# Patient Record
Sex: Male | Born: 1980 | Race: Black or African American | Hispanic: No | Marital: Single | Smoking: Former smoker
Health system: Southern US, Community
[De-identification: ages and names within clinical notes are randomized; demographics above are authoritative.]

## PROBLEM LIST (undated history)

## (undated) DIAGNOSIS — Z720 Tobacco use: Secondary | ICD-10-CM

## (undated) DIAGNOSIS — D869 Sarcoidosis, unspecified: Secondary | ICD-10-CM

## (undated) DIAGNOSIS — R590 Localized enlarged lymph nodes: Secondary | ICD-10-CM

## (undated) DIAGNOSIS — K219 Gastro-esophageal reflux disease without esophagitis: Secondary | ICD-10-CM

## (undated) HISTORY — DX: Tobacco use: Z72.0

## (undated) HISTORY — DX: Localized enlarged lymph nodes: R59.0

## (undated) HISTORY — PX: BRONCHOSCOPY: SUR163

## (undated) HISTORY — DX: Sarcoidosis, unspecified: D86.9

## (undated) HISTORY — DX: Gastro-esophageal reflux disease without esophagitis: K21.9

---

## 2005-02-16 ENCOUNTER — Emergency Department (HOSPITAL_COMMUNITY): Admission: EM | Admit: 2005-02-16 | Discharge: 2005-02-17 | Payer: Self-pay | Admitting: Emergency Medicine

## 2006-05-28 ENCOUNTER — Emergency Department (HOSPITAL_COMMUNITY): Admission: EM | Admit: 2006-05-28 | Discharge: 2006-05-28 | Payer: Self-pay | Admitting: Emergency Medicine

## 2006-07-22 ENCOUNTER — Emergency Department (HOSPITAL_COMMUNITY): Admission: EM | Admit: 2006-07-22 | Discharge: 2006-07-22 | Payer: Self-pay | Admitting: Emergency Medicine

## 2006-07-22 ENCOUNTER — Emergency Department (HOSPITAL_COMMUNITY): Admission: EM | Admit: 2006-07-22 | Discharge: 2006-07-22 | Payer: Self-pay | Admitting: Family Medicine

## 2006-11-05 ENCOUNTER — Emergency Department (HOSPITAL_COMMUNITY): Admission: EM | Admit: 2006-11-05 | Discharge: 2006-11-05 | Payer: Self-pay | Admitting: Emergency Medicine

## 2007-08-20 ENCOUNTER — Emergency Department (HOSPITAL_COMMUNITY): Admission: EM | Admit: 2007-08-20 | Discharge: 2007-08-20 | Payer: Self-pay | Admitting: Emergency Medicine

## 2009-07-28 ENCOUNTER — Emergency Department (HOSPITAL_COMMUNITY): Admission: EM | Admit: 2009-07-28 | Discharge: 2009-07-28 | Payer: Self-pay | Admitting: Emergency Medicine

## 2009-07-29 ENCOUNTER — Emergency Department (HOSPITAL_COMMUNITY): Admission: EM | Admit: 2009-07-29 | Discharge: 2009-07-30 | Payer: Self-pay | Admitting: Emergency Medicine

## 2011-04-28 NOTE — Consult Note (Signed)
[name], [name]             ACCOUNT NO.:  [id number]   MEDICAL RECORD NO.:  [id number]          PATIENT TYPE:  EMS   LOCATION:  ED                           FACILITY:  [facility]   PHYSICIAN:  Newman Pies, MD            DATE OF BIRTH:  August 18, 1981   DATE OF CONSULTATION:  07/30/2009  DATE OF DISCHARGE:  07/30/2009                                 CONSULTATION   CHIEF COMPLAINT:  Infected thyroglossal duct cyst.   HISTORY OF PRESENT ILLNESS:  The patient is a 30 year old African  American male who presents to the [facility] Emergency Room today  complaining of 3 weeks history of neck pain.  The severity of his neck  pain has gradually worsened over the past 3 weeks.  He complains of  significant dysphagia and odynophagia.  He was seen at the same  emergency room 1 day earlier.  At that time, he was treated with  azithromycin and was discharged home.  The patient returns today  complaining of worsening of his symptoms.  He also complains of  subjective fever for the last 2 days.  He has a history of recurrent  strep throat infections.  He denies any previous history of ENT surgery.  A neck CT scan was obtained today at the emergency room.  It shows a 1.6  cm midline neck cyst immediately superior to the hyoid bone.  The  location is suggestive of an infected thyroglossal duct cyst.  No other  lymphadenopathy or aerodigestive tract tumor or mass.   PAST MEDICAL HISTORY:  Recurrent strep throat infections.   PAST SURGICAL HISTORY:  None.   HOME MEDICATIONS:  Azithromycin.   ALLERGIES:  No known drug allergy.   SOCIAL HISTORY:  The patient is a current smoker.  He typically smokes  several cigars a day.  He has been doing that for the past 7-8 years.  He denies the use of illegal drugs.  Occasional drinker.   FAMILY HISTORY:  Noncontributory.   PHYSICAL EXAMINATION:  VITAL SIGNS:  Temperature 101.2, blood pressure  148/88, pulse 60, respirations 18, and oxygen saturation 96% on  room  air.  GENERAL:  The patient is a well-nourished and well-developed 30 year old  African American male in no acute distress.  He is alert and oriented  x3.  HEENT:  His pupils are equal, round, and reactive to light.  Extraocular  motion is intact.  Examination of the ears shows normal auricles,  external auditory canals, and tympanic membranes.  Nasal examination  shows normal mucosa, septum, and turbinates.  Oral cavity examination  shows normal lips, gums, tongue, oral cavity, and oropharyngeal mucosa.  2+ tonsils are noted bilaterally.  Several tonsillar lymphs are noted,  however, no obvious erythema or edema is noted.  NECK:  Palpation of the neck reveals a tender area adjacent to the hyoid  bone.  No obvious lymphadenopathy or masses noted.  The thyroid is  midline.  No palpable mass is noted.  The cervical range of motion is  limited by his neck pain.   IMPRESSION:  The  patient's history, physical exam findings, and the CT  findings are suggestive of an infected thyroglossal duct cyst.   RECOMMENDATIONS:  1. The patient will receive clindamycin 900 mg IV x1 in the emergency      room.  He will be discharged home on clindamycin 300 mg p.o. q.i.d.      for 10 days.  2. The patient will follow up in my office in approximately 1 week.  3. The patient will likely need to have the thyroglossal duct cyst      removed in accordance with a Sistrunk procedure.  The patient is      instructed to call the office if his symptoms worsen.      Newman Pies, MD  Electronically Signed     ST/MEDQ  D:  07/30/2009  T:  07/30/2009  Job:  [id number]

## 2011-05-18 ENCOUNTER — Inpatient Hospital Stay (HOSPITAL_COMMUNITY)
Admission: EM | Admit: 2011-05-18 | Discharge: 2011-05-21 | DRG: 076 | Disposition: A | Discharge status: Home / Self Care | Payer: BC Managed Care – PPO | Attending: Internal Medicine | Admitting: Internal Medicine

## 2011-05-18 ENCOUNTER — Emergency Department (HOSPITAL_COMMUNITY): Payer: BC Managed Care – PPO

## 2011-05-18 DIAGNOSIS — R079 Chest pain, unspecified: Secondary | ICD-10-CM | POA: Diagnosis present

## 2011-05-18 DIAGNOSIS — R599 Enlarged lymph nodes, unspecified: Secondary | ICD-10-CM | POA: Diagnosis present

## 2011-05-18 DIAGNOSIS — F17200 Nicotine dependence, unspecified, uncomplicated: Secondary | ICD-10-CM | POA: Diagnosis present

## 2011-05-18 DIAGNOSIS — Z8249 Family history of ischemic heart disease and other diseases of the circulatory system: Secondary | ICD-10-CM

## 2011-05-18 DIAGNOSIS — R9389 Abnormal findings on diagnostic imaging of other specified body structures: Secondary | ICD-10-CM

## 2011-05-18 DIAGNOSIS — K219 Gastro-esophageal reflux disease without esophagitis: Secondary | ICD-10-CM | POA: Diagnosis present

## 2011-05-18 DIAGNOSIS — R0989 Other specified symptoms and signs involving the circulatory and respiratory systems: Secondary | ICD-10-CM | POA: Diagnosis not present

## 2011-05-18 DIAGNOSIS — R0609 Other forms of dyspnea: Secondary | ICD-10-CM | POA: Diagnosis not present

## 2011-05-18 DIAGNOSIS — D869 Sarcoidosis, unspecified: Principal | ICD-10-CM | POA: Diagnosis present

## 2011-05-18 DIAGNOSIS — J99 Respiratory disorders in diseases classified elsewhere: Secondary | ICD-10-CM | POA: Diagnosis present

## 2011-05-18 DIAGNOSIS — R918 Other nonspecific abnormal finding of lung field: Secondary | ICD-10-CM | POA: Diagnosis not present

## 2011-05-19 ENCOUNTER — Encounter (HOSPITAL_COMMUNITY): Payer: Self-pay

## 2011-05-19 DIAGNOSIS — D869 Sarcoidosis, unspecified: Secondary | ICD-10-CM

## 2011-05-19 DIAGNOSIS — R079 Chest pain, unspecified: Secondary | ICD-10-CM

## 2011-05-19 LAB — CBC
HCT: 48.2 % (ref 39.0–52.0)
HCT: 42.6 % (ref 39.0–52.0)
Hemoglobin: 16.1 g/dL (ref 13.0–17.0)
MCHC: 33.4 g/dL (ref 30.0–36.0)
MCHC: 33.1 g/dL (ref 30.0–36.0)
MCV: 87.8 fL (ref 78.0–100.0)
Platelets: 241 10*3/uL (ref 150–400)
RBC: 5.49 MIL/uL (ref 4.22–5.81)
RBC: 4.91 MIL/uL (ref 4.22–5.81)
WBC: 6.2 10*3/uL (ref 4.0–10.5)

## 2011-05-19 LAB — POCT I-STAT, CHEM 8: Hemoglobin: 17.7 g/dL — ABNORMAL HIGH (ref 13.0–17.0)

## 2011-05-19 LAB — TROPONIN I: Troponin I: <0.3 ng/mL (ref <0.30)

## 2011-05-19 LAB — COMPREHENSIVE METABOLIC PANEL
AST: 36 U/L (ref 0–37)
Albumin: 3.1 g/dL — ABNORMAL LOW (ref 3.5–5.2)
Alkaline Phosphatase: 107 U/L (ref 39–117)
BUN: 13 mg/dL (ref 6–23)
Chloride: 101 mEq/L (ref 96–112)
GFR calc Af Amer: >60 mL/min (ref >60)
GFR calc non Af Amer: >60 mL/min (ref >60)
Glucose, Bld: 132 mg/dL — ABNORMAL HIGH (ref 70–99)
Potassium: 3.7 mEq/L (ref 3.5–5.1)
Sodium: 136 mEq/L (ref 135–145)

## 2011-05-19 LAB — DIFFERENTIAL
Basophils Absolute: 0 10*3/uL (ref 0.0–0.1)
Monocytes Absolute: 1.2 10*3/uL — ABNORMAL HIGH (ref 0.1–1.0)
Neutro Abs: 4.2 10*3/uL (ref 1.7–7.7)
Neutrophils Relative %: 58 % (ref 43–77)

## 2011-05-19 LAB — CK TOTAL AND CKMB (NOT AT ARMC)
Relative Index: 0.8 (ref 0.0–2.5)
Total CK: 291 U/L — ABNORMAL HIGH (ref 7–232)

## 2011-05-19 MED ORDER — IOHEXOL 300 MG/ML  SOLN
80.0000 mL | Freq: Once | INTRAMUSCULAR | Status: AC | PRN
  Administered 2011-05-19: 80 mL via INTRAVENOUS

## 2011-05-20 ENCOUNTER — Inpatient Hospital Stay (HOSPITAL_COMMUNITY): Payer: BC Managed Care – PPO

## 2011-05-20 ENCOUNTER — Other Ambulatory Visit: Payer: Self-pay | Admitting: Internal Medicine

## 2011-05-20 LAB — CARDIAC PANEL(CRET KIN+CKTOT+MB+TROPI)
CK, MB: 3 ng/mL (ref 0.3–4.0)
Relative Index: 1 (ref 0.0–2.5)
Troponin I: <0.3 ng/mL (ref <0.30)

## 2011-05-22 ENCOUNTER — Encounter: Payer: Self-pay | Admitting: Internal Medicine

## 2011-05-22 DIAGNOSIS — D869 Sarcoidosis, unspecified: Secondary | ICD-10-CM | POA: Insufficient documentation

## 2011-05-27 ENCOUNTER — Encounter: Payer: Self-pay | Admitting: Pulmonary Disease

## 2011-05-27 ENCOUNTER — Telehealth: Payer: Self-pay | Admitting: Internal Medicine

## 2011-05-27 ENCOUNTER — Ambulatory Visit (INDEPENDENT_AMBULATORY_CARE_PROVIDER_SITE_OTHER): Payer: BC Managed Care – PPO | Admitting: Pulmonary Disease

## 2011-05-27 VITALS — BP 118/80 | HR 77 | Temp 98.6°F | Ht 72.0 in | Wt 201.0 lb

## 2011-05-27 DIAGNOSIS — D869 Sarcoidosis, unspecified: Secondary | ICD-10-CM

## 2011-05-27 DIAGNOSIS — D860 Sarcoidosis of lung: Secondary | ICD-10-CM

## 2011-05-27 DIAGNOSIS — J99 Respiratory disorders in diseases classified elsewhere: Secondary | ICD-10-CM

## 2011-05-27 DIAGNOSIS — R05 Cough: Secondary | ICD-10-CM

## 2011-05-27 MED ORDER — PREDNISONE 10 MG PO TABS: ORAL_TABLET | ORAL | Status: DC

## 2011-05-27 NOTE — Telephone Encounter (Signed)
OV with Dr. Sung Amabile today at 3 pm.

## 2011-05-27 NOTE — Patient Instructions (Signed)
1) Take Prednisone as prescribed 2) Follow-up for full pulmonary function tests as scheduled

## 2011-05-29 NOTE — Progress Notes (Signed)
  Subjective:    Patient ID: [name], male    DOB: 03/19/1981, 30 y.o.   MRN: [id number]  HPI Hospitalized 6/5 - 6/7 for cough, CP, DOE and found to have extensive hilar and mediastinal adenopathy as well as diffuse bilateral AS dz.  Underwent FOB by Dr Sherene Sires 6/6 and discharged prior to final pathology results. Returns today to discuss FOB results. No change in symptoms. Continues to suffer DOE (class II symptoms). No fevers, chills, sweats. No hemoptysis, LE edema or calf tenderness.   Review of Systems  Constitutional: Positive for fatigue. Negative for fever and unexpected weight change.  HENT: Negative.   Eyes: Negative.   Respiratory: Positive for cough, chest tightness and shortness of breath.   Cardiovascular: Negative.   Gastrointestinal: Negative.   Genitourinary: Negative.   Musculoskeletal: Negative.   Neurological: Negative.   Hematological: Negative.   Psychiatric/Behavioral: Negative.        Objective:   Physical Exam  Constitutional: He is oriented to person, place, and time. He appears well-developed and well-nourished. No distress.  HENT:  Head: Normocephalic and atraumatic.  Nose: Nose normal.  Mouth/Throat: Oropharynx is clear and moist.  Eyes: Conjunctivae and EOM are normal. Pupils are equal, round, and reactive to light. No scleral icterus.  Neck: Normal range of motion. Neck supple. No JVD present. No thyromegaly present.  Cardiovascular: Normal rate, regular rhythm, normal heart sounds and intact distal pulses.   No murmur heard. Pulmonary/Chest: Effort normal and breath sounds normal. He has no wheezes. He has no rales.  Abdominal: Soft. Bowel sounds are normal. There is no tenderness.  Musculoskeletal: Normal range of motion. He exhibits no edema.  Lymphadenopathy:    He has no cervical adenopathy.  Neurological: He is alert and oriented to person, place, and time. He has normal reflexes.  Psychiatric: He has a normal mood and affect.    FOB  TBBx: noncaseating granulomatous inflammation c/w sarcoidosis Handheld spiro 6/13: c/w moderate restriction    Assessment & Plan:  New Dx of Pulmonary Sarcoidosis with moderate restriction on spirometry, severe Xray changes and moderate DOE  1)Prednisone 10 mg -Take 4 tablets each morning for two weeks (6/14 - 6/27), then 3 tablets each morning for two weeks (6/28-7/11), then 2 tablets each day until followup 2) Full PFTs scheduled  3) F/U with Dr Sherene Sires in 4 wks 4) Counseled @ length re: this new diagnosis

## 2011-06-02 NOTE — Op Note (Signed)
  [name], [name]             ACCOUNT NO.:  [id number]  MEDICAL RECORD NO.:  [id number]  LOCATION:  1529                         FACILITY:  [facility]  PHYSICIAN:  Charlaine Dalton. Sherene Sires, MD, FCCPDATE OF BIRTH:  1981-11-[birth year]  DATE OF PROCEDURE:  05/20/2011 DATE OF DISCHARGE:                              OPERATIVE REPORT   REFERRED BY:  [facility].  PROCEDURE PERFORMED:  Video fiber bronchoscopy with random transbronchial biopsies of the right lower lobe.  HISTORY AND INDICATIONS:  Please see dictated note.  This patient presented with atypical chest pain, but had what is probably incidentally noted extensive adenopathy and reticulonodular changes on chest x-ray consistent with sarcoid, and therefore bronchoscopy was indicated for a tissue diagnosis if possible.  Before the procedure, the patient was informed the risks, benefits, alternatives and a formal time-out procedure was performed.  He received a total of 100 mg IV Demerol and 10 mg IV Versed during procedure for adequate sedation, cough suppression while being continuously monitored by surface EC oximetry, and maintained adequate saturation throughout the procedure and sinus rhythm.  Using a standard flexible video fiberoptic bronchoscope, the right naris was easily cannulated regularization of entire pharynx, larynx.  The cords moved normally and no apparent upper airway lesions.  Using additional 1% lidocaine as needed, the entire tracheobronchial tree was then explored by video bronchoscopy with the following findings: 1. All the airways were normal. 2. The subsegmental level with no significant mucosal abnormality.  PROCEDURE:  Using a wedge position within the right lower lobe basal segments, random transbronchial biopsies x6 were obtained with minimal bleeding and no obvious alveolar hemorrhage or pneumothorax by a fluoroscopy and a followup chest x-ray is pending.  The patient tolerated well.  IMPRESSION:   Extensive adenopathy and reticulonodular infiltrates consistent with sarcoid.  RECOMMENDATIONS:  Await tissue confirmation.    The patient tolerated the procedure well and was returned to his room and we kept him n.p.o. for another 2 hours.  DIET:  Resume 2 hours after procedure.     Charlaine Dalton. Sherene Sires, MD, [facility]     MBW/MEDQ  D:  05/20/2011  T:  05/21/2011  Job:  [id number]  Electronically Signed by Sandrea Hughs MD FCCP on 06/02/2011 12:46:20 AM

## 2011-06-02 NOTE — Consult Note (Signed)
[name], [name]             ACCOUNT NO.:  [id number]  MEDICAL RECORD NO.:  [id number]  LOCATION:  1529                         FACILITY:  [facility]  PHYSICIAN:  Charlaine Dalton. Sherene Sires, MD, FCCPDATE OF BIRTH:  June 13, 1981  DATE OF CONSULTATION:  05/19/2011 DATE OF DISCHARGE:                                CONSULTATION   REQUESTING PHYSICIAN:  [facility].  REASON FOR CONSULTATION:  Abnormal chest x-ray/chest pain.  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  He was admitted with this history on May 20, 2011, with the chest CT suggesting widespread adenopathy and interstitial lung disease and I was asked to see him to consider a lung biopsy with the differential diagnosis being sarcoidosis versus lymphoma versus metastatic cancer.  The patient has minimal cough.  No significant history of myalgias, arthralgias, ocular complaints, fevers, chills, sweats, unintended weight loss.  PAST MEDICAL HISTORY:  Significant for the absence of previous medical illnesses or hospitalizations.  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  ALLERGIES:  None known.  MEDICATIONS:  Does not take them.  FAMILY HISTORY:  Negative for respiratory diseases or sarcoid.  REVIEW OF SYSTEMS:  All systems negative except as outlined above.  PHYSICAL EXAMINATION:  GENERAL:  This is a pleasant black male, in no acute distress. VITAL SIGNS:  Afebrile vital signs. HEENT:  Oropharynx clear. NECK:  Supple without cervical adenopathy or tenderness.  Trachea is midline. LUNGS:  Lung fields perfectly clear bilaterally. HEART:  Regular rate and rhythm without murmur, gallop or rub. ABDOMEN:  Abdomen is soft.  There  is tenderness in the right upper quadrant with possible positive Murphy's sign.  There is no guarding or rebound, however. EXTREMITIES:  Warm without calf tenderness, edema. SKIN EXAM:  Remarkable for the absence of rash. NEUROLOGIC:  He is alert and appropriate.  LABORATORY DATA:  Chemistry profile was normal.  LFTs were normal.  CBC was normal.  ACE level is pending and serum calcium is normal.  A chest x-ray suggests classic sarcoid which is sarcoid changes with hilar mediastinal adenopathy and mild to moderate reticular nodular infiltrates.  IMPRESSION: 1. Most likely this patient has sarcoid.  However, it may well be     "asymptomatic" in that it appears to have been present long-time on     x-ray, yet his symptoms are quite abrupt. 2. Atypical chest pain, only relieved by morphine.  His exam is     suggestive to me of gallbladder disease although he does not have     classic history and I would recommend an abdominal ultrasound as     well as continue treatment for GERD as already initiated and consider IBS if w/u neg.  I briefly discussed the risks, benefits, alternatives with the patient in detail and he agreed to proceed with a workup including a video bronchoscopy with transbronchial biopsy on May 20, 2011.  Charlaine Dalton. Sherene Sires, MD, [facility]     MBW/MEDQ  D:  05/19/2011  T:  05/20/2011  Job:  [id number]  Electronically Signed by Sandrea Hughs MD FCCP on 06/02/2011 12:45:07 AM

## 2011-06-14 NOTE — H&P (Signed)
[name], [name]             ACCOUNT NO.:  [id number]  MEDICAL RECORD NO.:  [id number]  LOCATION:  1529                         FACILITY:  [facility]  PHYSICIAN:  Lonia Blood, M.D.      DATE OF BIRTH:  31-Aug-1981  DATE OF ADMISSION:  05/18/2011 DATE OF DISCHARGE:                             HISTORY & PHYSICAL   PRIMARY CARE PHYSICIAN:  The patient does not have PCP.  CHIEF COMPLAINT:  Chest pain.  This is a 30 year old African American gentleman who presented to the emergency room at [facility] with complaints of chest pain that lasted probably for 1 week and he said that everyday he wakes up with this symptoms which come and go, but today the symptoms became worse and he started having throbbing, severe chest pain located in the lower chest and upper epigastric level during work and had to come to the emergency room for evaluation.  He denied any radiation of this pain or any associated symptoms such as dyspnea or diaphoresis.  PAST MEDICAL HISTORY:  The patient denied any previous illnesses or previous hospitalizations.  FAMILY HISTORY:  Significant for premature coronary artery disease.  His father died at age 109 of a massive heart attack.  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  He smokes cigars and says that he smokes 1 block cigars daily.  Denied alcohol or any illicit drugs.  ALLERGIES:  No known drug allergies.  CODE STATUS:  The patient is a full code.  HOME MEDICATIONS:  He denied any home medications.  REVIEW OF SYSTEMS:  The patient complains of chest pain as described above and denied any associated symptoms such as nausea, diaphoresis, or dyspnea.  He denied any diarrhea, constipation, hematemesis, hematochezia, hematuria, or dysuria, but he complained of occasional indigestion and takes Pepto-Bismol with relief of symptoms.  He denied any visual disturbances or blurred vision.  Denied arthralgia,  myalgia, headaches, night sweats, and recent weight loss.  PHYSICAL EXAMINATION:  VITAL SIGNS:  Blood pressure 105/73, temperature 98.4, pulse of 68, respirations 16, and pulse oximetry 96% on room air. HEENT:  Normocephalic, atraumatic.  Extraocular movements intact. Sclerae anicteric.  Mucous membranes pink, moist, and intact. NECK:  Without any JVD or carotid bruits. LUNGS:  Clear to auscultation bilaterally.  Respirations are unlabored. HEART:  Regular rate and rhythm.  There is a short split of S1.  No murmurs or gallops.  Pulses are equal in all 4 extremities. ABDOMEN:  Soft, nontender, and nondistended with positive bowel sounds present in all 4 quadrants.  No organomegaly or masses appreciated. NEURO:  Grossly intact.  No focal abnormalities.  The patient is alert and oriented x3. MUSCULOSKELETAL:  No joint deformities.  No muscle rigidity observed. All 4 extremities with full range of motion and intact motor strength.  LABORATORY DATA AND DIAGNOSTIC STUDIES:  White blood cell count 7.2, hemoglobin 16.1, hematocrit 48.2, and platelet count 241.  CK 291, MB 2.4, troponin less than 0.3.  Sodium 138, potassium 3.7, chloride 101, CO2 of 30, BUN 13, creatinine 1.3, and glucose 86.  EKG showed normal sinus rhythm without any acute changes.  Chest  x-ray revealed probable lymphadenopathy in the right paratracheal region and both hila. Following chest CT showed prominent diffuse lymphadenopathy involving the mediastinum and bilateral regions with nodular changes in the lungs and patchy nodular infiltrates in the superior segment of both floor of lungs.  Differential diagnosis would include lymphoma, metastasis, possible diffuse inflammatory/infectious process such as sarcoidosis  IMPRESSION AND PLAN: 1. Chest pain with abnormal chest CT.  The patient might need     pulmonary consult for biopsy for large sarcoidosis, although     diagnosis of I lymphoma could be considered as one of  the     differential diagnosis.  His white blood cell count of 7.2 and     differential that does not show any shift makes this diagnosis less     likely. 2. Gastroesophageal reflux disease.  We will start the patient on PPI. 3. Family history of premature coronary artery disease.  The patient     has significant disk factors such as family history of ongoing     tobacco use and male gender and prophylaxis with aspirin should be     considered. 4. The patient is a full code.     [name], P.A.   ______________________________ Lonia Blood, M.D.    MK/MEDQ  D:  05/19/2011  T:  05/19/2011  Job:  [id number]  Electronically Signed by [name] P.A. on 05/20/2011 06:59:52 AM Electronically Signed by Lonia Blood M.D. on 06/14/2011 12:28:44 AM

## 2011-06-15 ENCOUNTER — Ambulatory Visit (INDEPENDENT_AMBULATORY_CARE_PROVIDER_SITE_OTHER): Payer: BC Managed Care – PPO | Admitting: Internal Medicine

## 2011-06-15 DIAGNOSIS — D869 Sarcoidosis, unspecified: Secondary | ICD-10-CM

## 2011-06-15 LAB — PULMONARY FUNCTION TEST

## 2011-06-15 NOTE — Progress Notes (Signed)
PFT done today. 

## 2011-06-18 ENCOUNTER — Institutional Professional Consult (permissible substitution): Payer: BC Managed Care – PPO | Admitting: Internal Medicine

## 2011-06-20 NOTE — Discharge Summary (Signed)
  NAMEJEROMEY, [name]             ACCOUNT NO.:  [id number]  MEDICAL RECORD NO.:  [id number]  LOCATION:  1529                         FACILITY:  [facility]  PHYSICIAN:  Zannie Cove, MD     DATE OF BIRTH:  [birth year]  DATE OF ADMISSION:  05/18/2011 DATE OF DISCHARGE:  05/21/2011                              DISCHARGE SUMMARY   PRIMARY CARE PHYSICIAN:  None.  NEW PULMONOLOGIST:  Charlaine Dalton. Sherene Sires, MD, FCCP  DISCHARGE DIAGNOSES: 1. Extensive mediastinal and hilar lymphadenopathy and radicular     nodular infiltrates consistent with sarcoid.  However, biopsy     pending. 2. Tobacco use. 3. Gastroesophageal reflux disease.  DIAGNOSTIC INVESTIGATIONS: 1. Bronchoscopy by Dr. Sandrea Hughs on May 20, 2011, showed extensive     adenopathy and reticular nodular infiltrates consistent with     sarcoid.  He also had a trans bronchial biopsy performed of the     right lower lobe. 2. CT of the chest on May 19, 2011, shows prominent diffuse adenopathy     involving the mediastinum and bilateral hilar regions with nodular     changes in the lung with patchy nodular infiltrates in the superior     segment of both lung.  Differential includes lymphoma, metastasis,     sarcoid infections, etc. 3. Right upper quadrant ultrasound was normal. 4. Repeat chest x-ray showed no pneumothorax, increased density in the     right medial base, question atelectasis versus post biopsy     hemorrhage.  HOSPITAL COURSE:  [name] is a 30 year old pleasant African American gentleman presented to the hospital with ongoing chest pain for 1 week. On further evaluation with an x-ray, he was found to have extensive adenopathy, subsequently underwent a CT and a biopsy. 1. Extensive mediastinal and hilar adenopathy with reticular nodular     infiltrates seen on CT, suspicion was high for sarcoidosis versus     lymphoma.  He had an ACE level done which is elevated at 62.     Currently, preliminary working diagnosis  is sarcoid.  However,     biopsy results are pending at this point.  The patient will follow     up with Dr. Sandrea Hughs and he will determine further treatment     and management for this. 2. GERD.  Started on a proton pump inhibitor.  DISCHARGE CONDITION:  Stable.  PHYSICAL EXAMINATION:  VITAL SIGNS:  Temp is 98.9, pulse 80, blood pressure 117/70, respirations 18, and satting 98% on room air.  DISCHARGE FOLLOWUP:  Dr. Sandrea Hughs in 2 weeks.     Zannie Cove, MD     PJ/MEDQ  D:  05/21/2011  T:  05/21/2011  Job:  [id number]  cc:   Charlaine Dalton. Sherene Sires, MD, FCCP [address] [place] [address]  Electronically Signed by Zannie Cove  on 06/20/2011 12:48:27 PM

## 2011-06-30 ENCOUNTER — Encounter: Payer: Self-pay | Admitting: Internal Medicine

## 2011-07-02 LAB — AFB CULTURE WITH SMEAR (NOT AT ARMC)

## 2012-09-10 ENCOUNTER — Encounter (HOSPITAL_COMMUNITY): Payer: Self-pay | Admitting: Emergency Medicine

## 2012-09-10 ENCOUNTER — Emergency Department (HOSPITAL_COMMUNITY): Payer: Self-pay

## 2012-09-10 ENCOUNTER — Emergency Department (HOSPITAL_COMMUNITY)
Admission: EM | Admit: 2012-09-10 | Discharge: 2012-09-10 | Disposition: A | Discharge status: Home / Self Care | Payer: Self-pay | Attending: Emergency Medicine | Admitting: Emergency Medicine

## 2012-09-10 DIAGNOSIS — R51 Headache: Secondary | ICD-10-CM | POA: Insufficient documentation

## 2012-09-10 MED ORDER — IBUPROFEN 800 MG PO TABS
800.0000 mg | ORAL_TABLET | Freq: Once | ORAL | Status: DC
  Filled 2012-09-10: qty 1

## 2012-09-10 MED ORDER — IBUPROFEN 800 MG PO TABS
800.0000 mg | ORAL_TABLET | Freq: Once | ORAL | Status: AC
  Administered 2012-09-10: 800 mg via ORAL
  Filled 2012-09-10: qty 1

## 2012-09-10 NOTE — ED Notes (Signed)
Per patient, has had headache for 2 weeks, slight dizziness-no N/V-slight sensitivity to light

## 2012-09-10 NOTE — ED Provider Notes (Signed)
History     CSN: [id number]  Arrival date & time 09/10/12  1020   First MD Initiated Contact with Patient 09/10/12 1059      Chief Complaint  Patient presents with  . Headache    (Consider location/radiation/quality/duration/timing/severity/associated sxs/prior treatment) Patient is a 31 y.o. male presenting with headaches. The history is provided by the patient. No language interpreter was used.  Headache  This is a recurrent problem. The current episode started more than 1 week ago. The problem occurs hourly. The problem has been gradually worsening. The headache is associated with emotional stress and bright light. The pain is located in the right unilateral and temporal region. The quality of the pain is described as dull and throbbing. The pain is at a severity of 6/10. The pain is moderate. The pain does not radiate. Pertinent negatives include no fever, no near-syncope, no nausea and no vomiting. He has tried nothing for the symptoms.   31 year old male who has intermittent right temporal dull throbbing headache x 2-3 weeks. Denies nausea vomiting or acute onset with severe pain. Patient is taking nothing for his pain. Mild photophobia.  Past Medical History  Diagnosis Date  . Sarcoidosis   . GERD (gastroesophageal reflux disease)   . Mediastinal lymphadenopathy   . Hilar lymphadenopathy   . Tobacco abuse     Past Surgical History  Procedure Date  . Bronchoscopy     Family History  Problem Relation Age of Onset  . Coronary artery disease    . Heart attack Father     16    History  Substance Use Topics  . Smoking status: Former Smoker    Types: Cigars    Quit date: 05/18/2011  . Smokeless tobacco: Not on file   Comment: 1-2 cigars a day x 10 years  . Alcohol Use: Yes     on weekends      Review of Systems  Constitutional: Negative for fever.  HENT: Negative for neck pain and neck stiffness.   Cardiovascular: Negative for near-syncope.  Gastrointestinal:  Negative for nausea and vomiting.  Neurological: Positive for headaches. Negative for dizziness, facial asymmetry, speech difficulty, weakness, light-headedness and numbness.  Psychiatric/Behavioral: Negative for disturbed wake/sleep cycle.    Allergies  Review of patient's allergies indicates no known allergies.  Home Medications  No current outpatient prescriptions on file.  BP 113/68  Pulse 51  Temp 97.9 F (36.6 C) (Oral)  Resp 18  SpO2 99%  Physical Exam  Nursing note and vitals reviewed. Constitutional: He is oriented to person, place, and time. He appears well-developed and well-nourished.  HENT:  Head: Normocephalic.  Eyes: Conjunctivae normal and EOM are normal. Pupils are equal, round, and reactive to light.  Neck: Normal range of motion. Neck supple.  Cardiovascular: Normal rate.   Pulmonary/Chest: Effort normal.  Abdominal: Soft.  Musculoskeletal: Normal range of motion.  Neurological: He is alert and oriented to person, place, and time. He has normal strength and normal reflexes. No cranial nerve deficit or sensory deficit. GCS eye subscore is 4. GCS verbal subscore is 5. GCS motor subscore is 6.  Skin: Skin is warm and dry.  Psychiatric: He has a normal mood and affect.    ED Course  Procedures (including critical care time)  Labs Reviewed - No data to display Ct Head Wo Contrast  09/10/2012  *RADIOLOGY REPORT*  Clinical Data: Right temporal headache  CT HEAD WITHOUT CONTRAST  Technique:  Contiguous axial images were obtained from the base  of the skull through the vertex without contrast.  Comparison: None.  Findings: No evidence of parenchymal hemorrhage or extra-axial fluid collection. No mass lesion, mass effect, or midline shift.  No CT evidence of acute infarction.  Cerebral volume is age appropriate.  No ventriculomegaly.  The visualized paranasal sinuses are essentially clear. The mastoid air cells are unopacified.  No evidence of calvarial fracture.   IMPRESSION: Normal head CT.   Original Report Authenticated By: Charline Bills, M.D.      1. Headache       MDM  Intermittent headache x2-3 weeks with a negative CT of the brain that was reviewed by myself. Moderate relief with ibuprofen 800 in the ER today. Patient will continue to take ibuprofen for pain and followup with PCP of his choice. He understands to return to the ER for severe acute sharp pain with uncontrolled nausea and vomiting.        Remi Haggard, NP 09/11/12 [phone]

## 2012-09-16 NOTE — ED Provider Notes (Signed)
Medical screening examination/treatment/procedure(s) were performed by non-physician practitioner and as supervising physician I was immediately available for consultation/collaboration.   Brigit Doke M Symphanie Cederberg, MD 09/16/12 1758 

## 2013-02-12 IMAGING — CT CT CHEST W/ CM
2 of 4 series · 15 of 36 positions shown, 18 images · IV contrast (omnipaque)
Comparison: Chest x-ray 05/18/2011

CLINICAL DATA: Central chest pain, shortness of breath, and
nonproductive cough.  Abnormal chest x-ray.

CT CHEST WITH CONTRAST
TECHNIQUE: Multidetector CT imaging of the chest was performed
following the standard protocol during bolus administration of
intravenous contrast.
Contrast: 100 ml Omnipaque 300

[Series 2: chest with st · axial · 0.67mm/px · z∈[-124,+131]mm · 12 of 61 slices shown, 15 images]
[im 5/61  mediastinal]
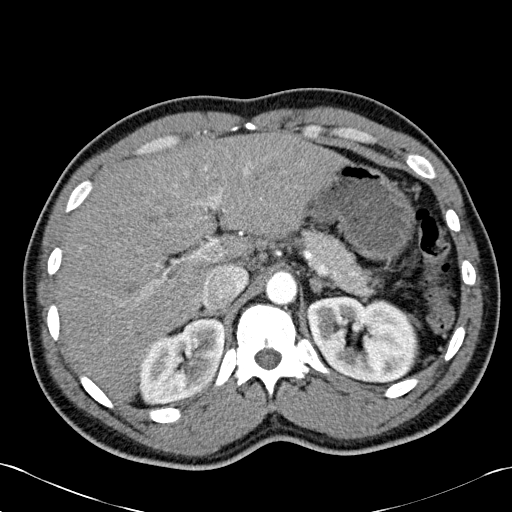
[im 5/61  lung]
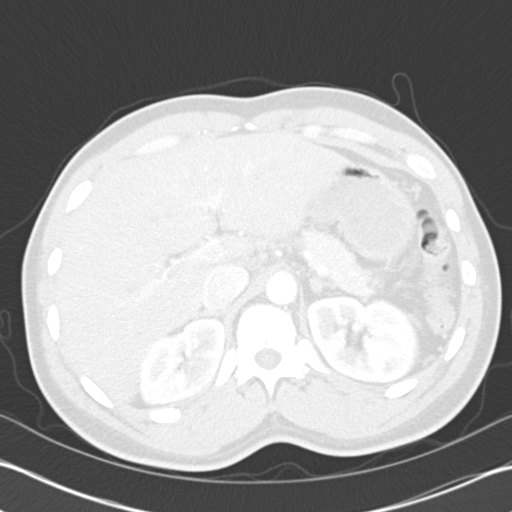
[im 9/61  lung]
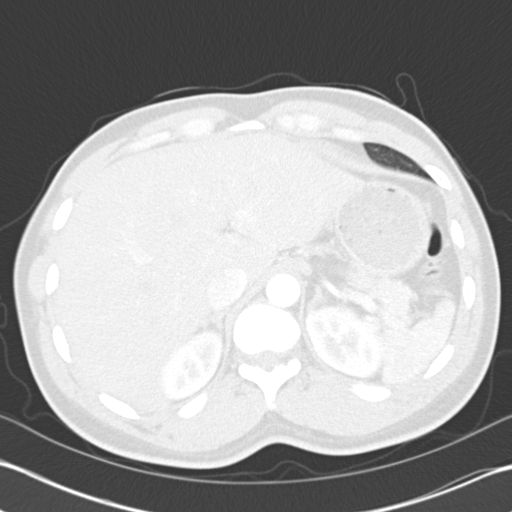
[im 13/61  lung]
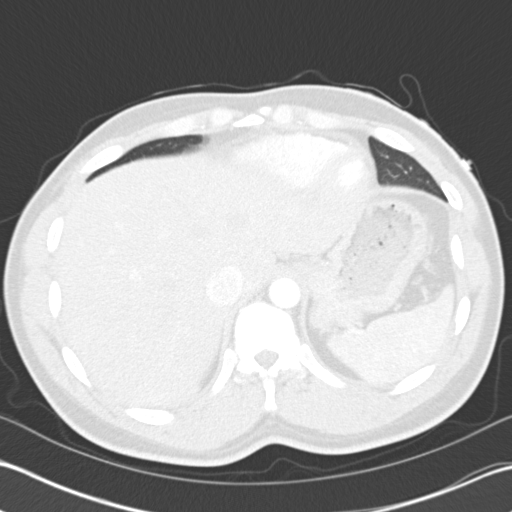
[im 18/61  lung]
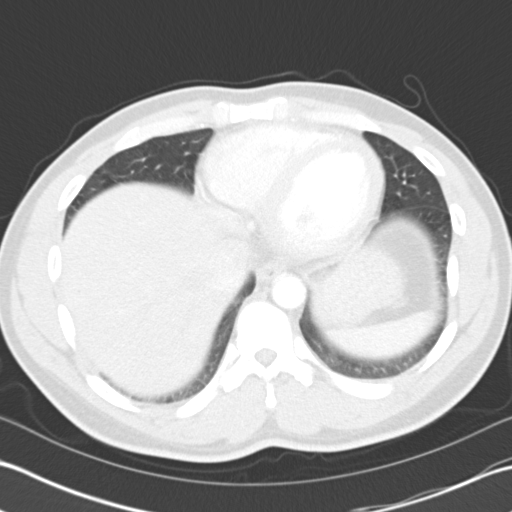
[im 22/61  mediastinal]
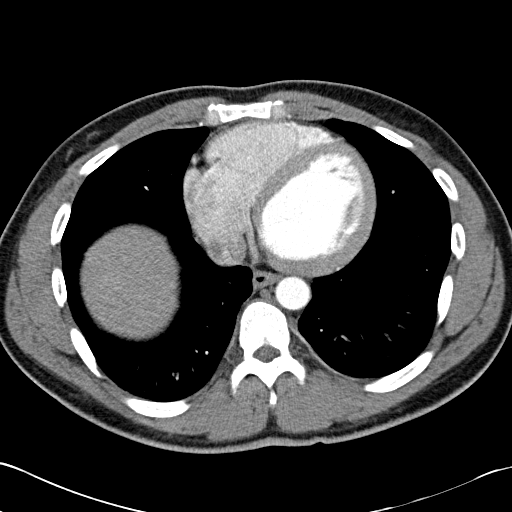
[im 22/61  lung]
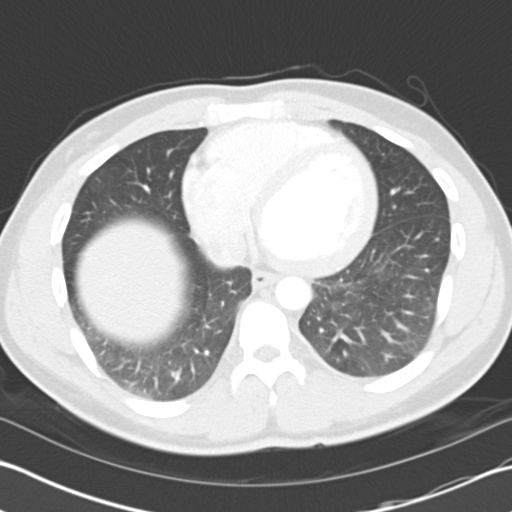
[im 26/61  lung]
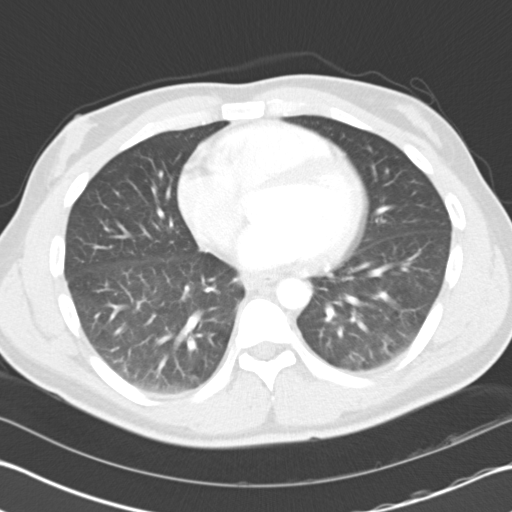
[im 35/61  lung]
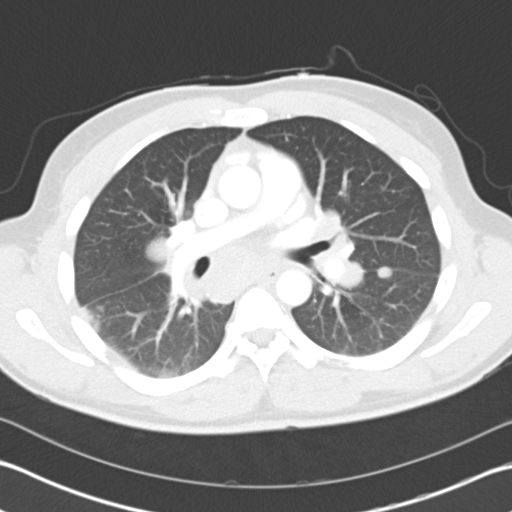
[im 39/61  lung]
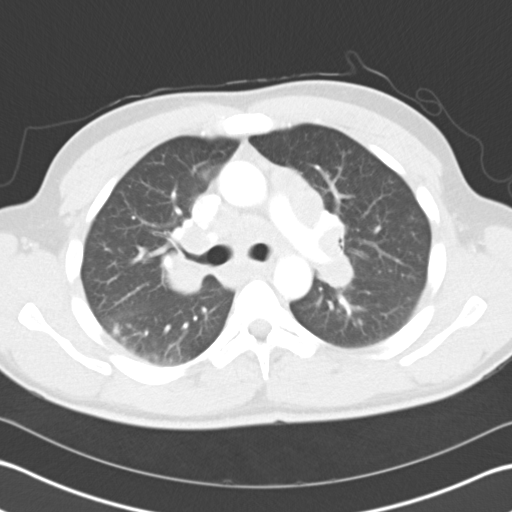
[im 43/61  mediastinal]
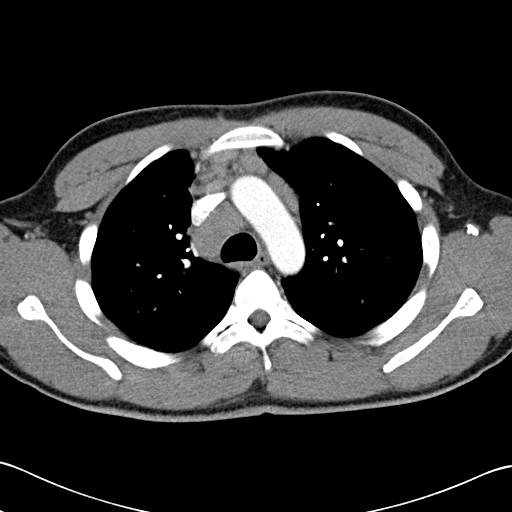
[im 43/61  lung]
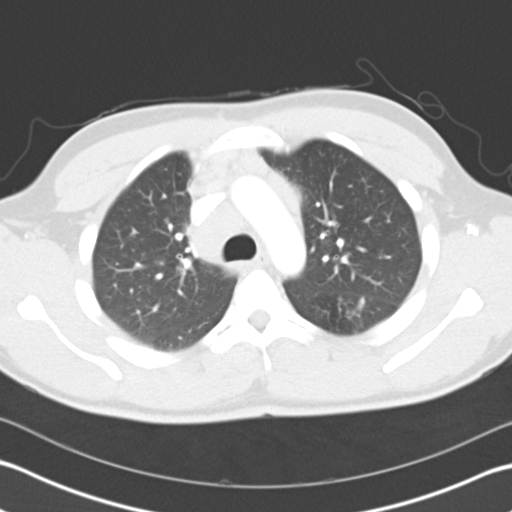
[im 48/61  lung]
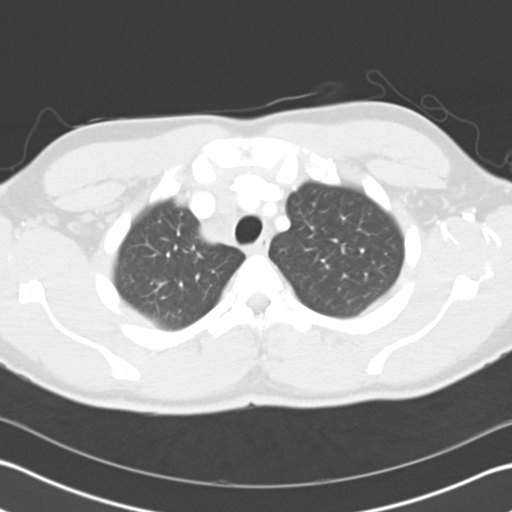
[im 52/61  lung]
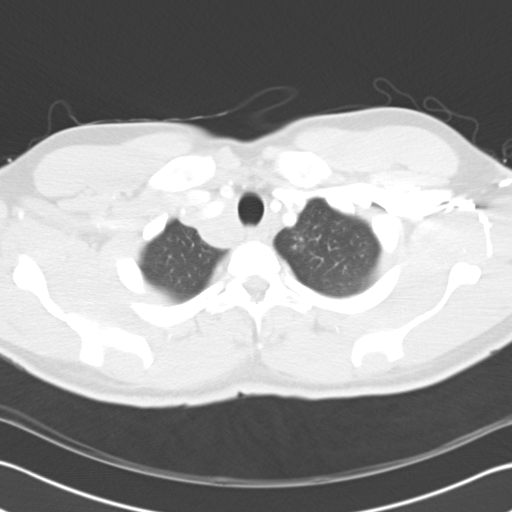
[im 56/61  lung]
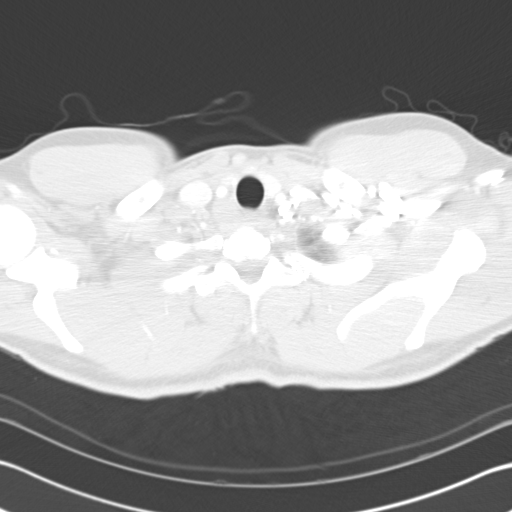

[Series 602: <mpr thick range> · coronal · 0.67mm/px · 3 of 106 slices shown]
[im 22/106  lung]
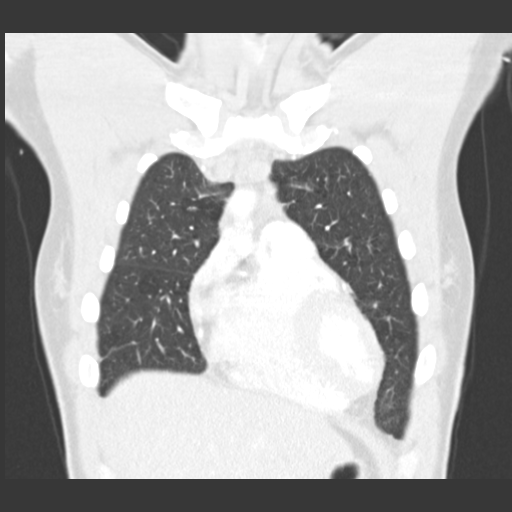
[im 43/106  lung]
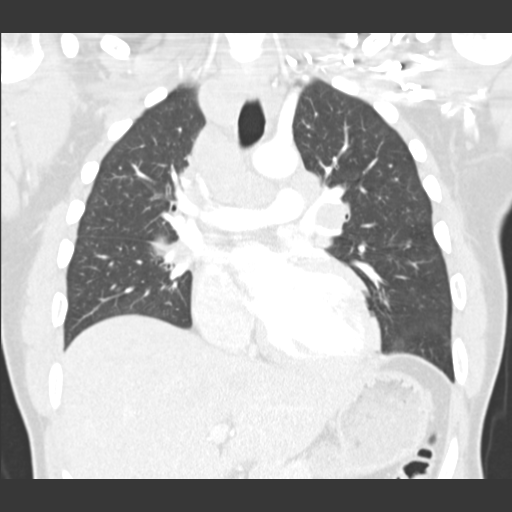
[im 64/106  lung]
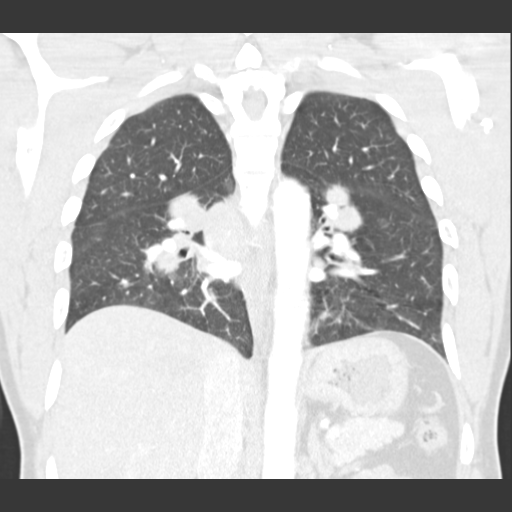

[15 of 36 positions shown; findings below may reference images not displayed]

FINDINGS: There is extensive bilateral hilar and diffuse
mediastinal lymphadenopathy throughout the mediastinum.  Enlarged
lymph nodes are demonstrated in the thoracic inlet, right
paratracheal and pretracheal region, subcarinal regions, as ago
esophageal recess, anterior mediastinum, aortopulmonic window, and
in the superior and inferior aspects of both hilar regions. Largest
right paratracheal/pretracheal lymph node measures about 2.7 x
cm. Right infrahilar lymph node measures 2.5 x 3.1 cm.

No significant axillary or supraclavicular lymphadenopathy noted.
Small focal rounded nodules are demonstrated in the superior
segment of the left lower lobe along the anterior fissure ( 9 mm)
and in the inferior medial aspect of the left upper lung ( 7 mm) as
well as in the right middle lung adjacent to the pericardial
surface (8 mm).  There are patchy nodular appearing infiltrates in
the superior segments of both lower lungs.  No significant
interstitial changes.  No significant compromise of trachea or
vessels.  Differential diagnosis would include lymphoma,
metastasis, or possibly diffuse inflammatory process such as
sarcoidosis.
IMPRESSION: Prominent diffuse lymphadenopathy involving the mediastinum and
bilateral hilar regions with nodular changes in the lungs and
patchy nodular infiltrates in the superior segments of both lower
lungs.  Differential diagnosis would include lymphoma, metastasis,
or possibly diffuse inflammatory/infectious process such as
sarcoidosis.

## 2013-03-04 ENCOUNTER — Emergency Department (HOSPITAL_COMMUNITY): Payer: Self-pay

## 2013-03-04 ENCOUNTER — Encounter (HOSPITAL_COMMUNITY): Payer: Self-pay | Admitting: Emergency Medicine

## 2013-03-04 ENCOUNTER — Emergency Department (HOSPITAL_COMMUNITY)
Admission: EM | Admit: 2013-03-04 | Discharge: 2013-03-04 | Disposition: A | Discharge status: Home / Self Care | Payer: Self-pay | Attending: Emergency Medicine | Admitting: Emergency Medicine

## 2013-03-04 DIAGNOSIS — J029 Acute pharyngitis, unspecified: Secondary | ICD-10-CM | POA: Insufficient documentation

## 2013-03-04 DIAGNOSIS — Z8619 Personal history of other infectious and parasitic diseases: Secondary | ICD-10-CM | POA: Insufficient documentation

## 2013-03-04 DIAGNOSIS — J020 Streptococcal pharyngitis: Secondary | ICD-10-CM

## 2013-03-04 DIAGNOSIS — R062 Wheezing: Secondary | ICD-10-CM | POA: Insufficient documentation

## 2013-03-04 DIAGNOSIS — R42 Dizziness and giddiness: Secondary | ICD-10-CM | POA: Insufficient documentation

## 2013-03-04 DIAGNOSIS — Z8719 Personal history of other diseases of the digestive system: Secondary | ICD-10-CM | POA: Insufficient documentation

## 2013-03-04 DIAGNOSIS — R112 Nausea with vomiting, unspecified: Secondary | ICD-10-CM | POA: Insufficient documentation

## 2013-03-04 DIAGNOSIS — Z87891 Personal history of nicotine dependence: Secondary | ICD-10-CM | POA: Insufficient documentation

## 2013-03-04 DIAGNOSIS — Q892 Congenital malformations of other endocrine glands: Secondary | ICD-10-CM | POA: Insufficient documentation

## 2013-03-04 DIAGNOSIS — Z8679 Personal history of other diseases of the circulatory system: Secondary | ICD-10-CM | POA: Insufficient documentation

## 2013-03-04 LAB — BASIC METABOLIC PANEL
CO2: 23 mEq/L (ref 19–32)
Chloride: 99 mEq/L (ref 96–112)
Creatinine, Ser: 1 mg/dL (ref 0.50–1.35)

## 2013-03-04 LAB — CBC WITH DIFFERENTIAL/PLATELET
Eosinophils Relative: 0 % (ref 0–5)
HCT: 41.8 % (ref 39.0–52.0)
Hemoglobin: 14.5 g/dL (ref 13.0–17.0)
Lymphocytes Relative: 11 % — ABNORMAL LOW (ref 12–46)
Lymphs Abs: 1.9 10*3/uL (ref 0.7–4.0)
MCV: 86.7 fL (ref 78.0–100.0)
Monocytes Absolute: 2.1 10*3/uL — ABNORMAL HIGH (ref 0.1–1.0)
Platelets: 294 10*3/uL (ref 150–400)
RBC: 4.82 MIL/uL (ref 4.22–5.81)
WBC: 17.1 10*3/uL — ABNORMAL HIGH (ref 4.0–10.5)

## 2013-03-04 LAB — RAPID STREP SCREEN (MED CTR MEBANE ONLY): Streptococcus, Group A Screen (Direct): POSITIVE — AB

## 2013-03-04 MED ORDER — IOHEXOL 300 MG/ML  SOLN
100.0000 mL | Freq: Once | INTRAMUSCULAR | Status: AC | PRN
  Administered 2013-03-04: 100 mL via INTRAVENOUS

## 2013-03-04 MED ORDER — ALBUTEROL SULFATE HFA 108 (90 BASE) MCG/ACT IN AERS
2.0000 | INHALATION_SPRAY | RESPIRATORY_TRACT | Status: DC | PRN
  Administered 2013-03-04: 2 via RESPIRATORY_TRACT
  Filled 2013-03-04: qty 6.7

## 2013-03-04 MED ORDER — ALBUTEROL SULFATE (5 MG/ML) 0.5% IN NEBU
2.5000 mg | INHALATION_SOLUTION | Freq: Once | RESPIRATORY_TRACT | Status: AC
  Administered 2013-03-04: 2.5 mg via RESPIRATORY_TRACT
  Filled 2013-03-04: qty 0.5

## 2013-03-04 MED ORDER — PENICILLIN G BENZATHINE 1200000 UNIT/2ML IM SUSP: 1.2000 10*6.[IU] | Freq: Once | INTRAMUSCULAR | Status: DC

## 2013-03-04 MED ORDER — OXYCODONE-ACETAMINOPHEN 5-325 MG PO TABS
2.0000 | ORAL_TABLET | Freq: Once | ORAL | Status: AC
  Administered 2013-03-04: 2 via ORAL
  Filled 2013-03-04: qty 2

## 2013-03-04 MED ORDER — IPRATROPIUM BROMIDE 0.02 % IN SOLN
0.5000 mg | Freq: Once | RESPIRATORY_TRACT | Status: AC
  Administered 2013-03-04: 0.5 mg via RESPIRATORY_TRACT
  Filled 2013-03-04: qty 2.5

## 2013-03-04 MED ORDER — PREDNISONE 20 MG PO TABS: 40.0000 mg | ORAL_TABLET | Freq: Every day | ORAL | Status: DC

## 2013-03-04 MED ORDER — AMOXICILLIN 875 MG PO TABS: 875.0000 mg | ORAL_TABLET | Freq: Two times a day (BID) | ORAL | Status: DC

## 2013-03-04 MED ORDER — PREDNISOLONE SODIUM PHOSPHATE 15 MG/5ML PO SOLN
10.0000 mg | Freq: Every day | ORAL | Status: DC
  Administered 2013-03-04: 10 mg via ORAL
  Filled 2013-03-04 (×2): qty 5

## 2013-03-04 NOTE — ED Notes (Signed)
RT called

## 2013-03-04 NOTE — Progress Notes (Signed)
Patient instructed on proper use of MDI with spacer. Informed patient that albuterol is a fast acting bronchodilator and to use every 4 hrs if he has wheezing or tightness in his chest not associated with chest pain. Patient works from [employer] to [employer] abd has a avaiability to use inhaler at work.Patient demonstrated correct use as far a shaking inhaler for 10 seconds, inhale slow deep breath, hold for 5 seconds, wait 5 minutes then repeat.

## 2013-03-04 NOTE — ED Notes (Signed)
PA at bedside.

## 2013-03-04 NOTE — ED Provider Notes (Signed)
Medical screening examination/treatment/procedure(s) were performed by non-physician practitioner and as supervising physician I was immediately available for consultation/collaboration.   Love Milbourne B. Bernette Mayers, MD 03/04/13 4098

## 2013-03-04 NOTE — ED Provider Notes (Signed)
History     CSN: [id number]  Arrival date & time 03/04/13  0600   First MD Initiated Contact with Patient 03/04/13 [phone]      Chief Complaint  Patient presents with  . Sore Throat  . Nausea  . Dizziness    (Consider location/radiation/quality/duration/timing/severity/associated sxs/prior treatment) HPI Comments: This is a 32 year old male, past medical history remarkable for sarcoidosis, GERD, mediastinal and hilar lymphadenopathy, who presents emergency department with chief complaint of sore throat. 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 States that he noticed a sore throat 2 days ago. It is associated with nausea and vomiting. He is tried taking ibuprofen with some relief. He states that currently his pain is 9/10. The pain does not radiate. It is worsened with swallowing. He denies any bloody or bilious vomiting. Denies diarrhea.   The history is provided by the patient. No language interpreter was used.    Past Medical History  Diagnosis Date  . Sarcoidosis   . GERD (gastroesophageal reflux disease)   . Mediastinal lymphadenopathy   . Hilar lymphadenopathy   . Tobacco abuse     Past Surgical History  Procedure Laterality Date  . Bronchoscopy      Family History  Problem Relation Age of Onset  . Coronary artery disease    . Heart attack Father     49    History  Substance Use Topics  . Smoking status: Former Smoker    Types: Cigars    Quit date: 05/18/2011  . Smokeless tobacco: Not on file     Comment: 1-2 cigars a day x 10 years  . Alcohol Use: 3.2 oz/week    2 Cans of beer, 4 Drinks containing 0.5 oz of alcohol per week     Comment: on weekends      Review of Systems  All other systems reviewed and are negative.    Allergies  Review of patient's allergies indicates no known allergies.  Home Medications   Current Outpatient Rx  Name  Route  Sig  Dispense  Refill  . ibuprofen (ADVIL,MOTRIN)  200 MG tablet   Oral   Take 400 mg by mouth every 6 (six) hours as needed for pain or headache.           BP 124/58  Pulse 65  Temp(Src) 98.8 F (37.1 C) (Oral)  Resp 18  Ht 6' (1.829 m)  Wt 215 lb (97.523 kg)  BMI 29.15 kg/m2  SpO2 99%  Physical Exam  Nursing note and vitals reviewed. Constitutional: He is oriented to person, place, and time. He appears well-developed and well-nourished.  HENT:  Head: Normocephalic and atraumatic.  Nose: Nose normal.  Mouth/Throat: Oropharynx is clear and moist. No oropharyngeal exudate.  Bilateral inflamed and enlarged tonsils with exudates, does not appear to be tonsillar or peritonsillar abscesses, uvula is midline, airway is intact, no signs of Ludwig's angina  Eyes: Conjunctivae and EOM are normal. Pupils are equal, round, and reactive to light. Right eye exhibits no discharge. Left eye exhibits no discharge. No scleral icterus.  Neck: Normal range of motion. Neck supple. No JVD present.  Cardiovascular: Normal rate, regular rhythm and normal heart sounds.  Exam reveals no gallop and no friction rub.   No murmur heard. Pulmonary/Chest: Effort normal. No respiratory distress. He has wheezes. He has no rales. He exhibits no tenderness.  Mild diffuse wheezes.  Abdominal: Soft. Bowel sounds are normal. He exhibits  no distension and no mass. There is no tenderness. There is no rebound and no guarding.  Musculoskeletal: Normal range of motion. He exhibits no edema and no tenderness.  Neurological: He is alert and oriented to person, place, and time. He has normal reflexes.  CN 3-12 intact  Skin: Skin is warm and dry.  Psychiatric: He has a normal mood and affect. His behavior is normal. Judgment and thought content normal.    ED Course  Procedures (including critical care time)  Labs Reviewed  RAPID STREP SCREEN  CBC WITH DIFFERENTIAL  BASIC METABOLIC PANEL   Results for orders placed during the hospital encounter of 03/04/13  RAPID  STREP SCREEN      Result Value Range   Streptococcus, Group A Screen (Direct) POSITIVE (*) NEGATIVE  CBC WITH DIFFERENTIAL      Result Value Range   WBC 17.1 (*) 4.0 - 10.5 K/uL   RBC 4.82  4.22 - 5.81 MIL/uL   Hemoglobin 14.5  13.0 - 17.0 g/dL   HCT 16.1  09.6 - 04.5 %   MCV 86.7  78.0 - 100.0 fL   MCH 30.1  26.0 - 34.0 pg   MCHC 34.7  30.0 - 36.0 g/dL   RDW 40.9  81.1 - 91.4 %   Platelets 294  150 - 400 K/uL   Neutrophils Relative 77  43 - 77 %   Neutro Abs 13.1 (*) 1.7 - 7.7 K/uL   Lymphocytes Relative 11 (*) 12 - 46 %   Lymphs Abs 1.9  0.7 - 4.0 K/uL   Monocytes Relative 12  3 - 12 %   Monocytes Absolute 2.1 (*) 0.1 - 1.0 K/uL   Eosinophils Relative 0  0 - 5 %   Eosinophils Absolute 0.0  0.0 - 0.7 K/uL   Basophils Relative 0  0 - 1 %   Basophils Absolute 0.0  0.0 - 0.1 K/uL  BASIC METABOLIC PANEL      Result Value Range   Sodium 133 (*) 135 - 145 mEq/L   Potassium 4.1  3.5 - 5.1 mEq/L   Chloride 99  96 - 112 mEq/L   CO2 23  19 - 32 mEq/L   Glucose, Bld 113 (*) 70 - 99 mg/dL   BUN 12  6 - 23 mg/dL   Creatinine, Ser 7.82  0.50 - 1.35 mg/dL   Calcium 8.8  8.4 - 95.6 mg/dL   GFR calc non Af Amer >90  >90 mL/min   GFR calc Af Amer >90  >90 mL/min   Dg Chest 2 View  03/04/2013  *RADIOLOGY REPORT*  Clinical Data: Sore throat, dizzy  CHEST - 2 VIEW  Comparison: 06/06,  Findings: Mild heart size and vascular pattern are normal.  Lungs are clear.  No pleural effusions.  IMPRESSION: No acute abnormalities.   Original Report Authenticated By: Esperanza Heir, M.D.    Ct Soft Tissue Neck W Contrast  03/04/2013  *RADIOLOGY REPORT*  Clinical Data: Sore throat, nausea, and dizziness for 3 days, history of thyroglossal duct cyst  CT NECK WITH CONTRAST  Technique:  Multidetector CT imaging of the neck was performed with intravenous contrast.  Contrast: OMNIPAQUE IOHEXOL 300 MG/ML  SOLN  Comparison: 07/29/2009  Findings: The previously identified peripherally enhancing low attenuation  structure centrally in the tongue base is much less apparent on today's study.  It measures approximately 3 mm in diameter as compared to 13 mm previously.  There is no other evidence of deep space mass  or abscess.  There is no mucosal lesion identified.  Thyroid appears normal.  Vocal folds appear normal. No abnormalities identified involving the epiglottis.  Lymphoid tissue in the region of Waldeyer's Ring is hypertrophic and mildly heterogeneous causing moderate airway narrowing.  There are numerous enlarged lymph nodes in both carotid chains and posterior triangles.  There are enlarged bilateral jugular digastric lymph nodes.  Lung apices are clear.  IMPRESSION: Bilateral reactive adenopathy.  Significant hypertrophy of Waldeyer Ring lymphoid tonsillar tissue causing moderate airway narrowing. Known thyroglossal duct cyst barely visible.   Original Report Authenticated By: Esperanza Heir, M.D.       1. Strep throat       MDM  32 year old male with sore throat. History of thyroglossal cyst.  This was shown on CT soft tissue neck in 2010.  No intervention was required at the time, but the patient was told that if the symptoms worsened, it might need to be removed. Will evaluate with CT.  Patient also has some wheezing.  He states that he has quit taking his steroids for sarcoid.  Will give breathing treatment and CXR.  8:02 AM Rapid strep is positive.  CT still indicated to rule out abscess.    Re-evaluation, the patient is no longer having any wheezing.  9:30 AM Discussed labs and imaging results with Dr. Jeraldine Loots.  At this time, the patient is not having any increased work of breathing.  His airway is open and intact.  He is not in any distress.  Will discharge the patient with ENT follow up in 2 days.  Will give Amoxicillin and Prednisone.  Patient advised to return for worsening symptoms.  Patient understands and agrees with the plan.  He is stable and ready for  discharge        Roxy Horseman, PA-C 03/04/13 1610

## 2013-03-04 NOTE — ED Notes (Signed)
Pt stated symptoms started Thursday morning. Pt states has cyst on throat and been having nausea and a sore throat since then, pt denies any pain abdomen.

## 2013-03-04 NOTE — ED Notes (Signed)
Pt alert and oriented x4. Respirations even and unlabored, bilateral symmetrical rise and fall of chest. Skin warm and dry. In no acute distress. Denies needs.   

## 2013-03-04 NOTE — ED Notes (Signed)
Pt alert and oriented x4. Respirations even and unlabored, bilateral symmetrical rise and fall of chest. Skin warm and dry. In no acute distress. Denies needs.   RT called

## 2014-04-14 ENCOUNTER — Emergency Department (HOSPITAL_COMMUNITY)
Admission: EM | Admit: 2014-04-14 | Discharge: 2014-04-14 | Disposition: A | Discharge status: Home / Self Care | Payer: BC Managed Care – PPO | Attending: Emergency Medicine | Admitting: Emergency Medicine

## 2014-04-14 ENCOUNTER — Encounter (HOSPITAL_COMMUNITY): Payer: Self-pay | Admitting: Emergency Medicine

## 2014-04-14 DIAGNOSIS — Z87891 Personal history of nicotine dependence: Secondary | ICD-10-CM | POA: Insufficient documentation

## 2014-04-14 DIAGNOSIS — M25512 Pain in left shoulder: Secondary | ICD-10-CM

## 2014-04-14 DIAGNOSIS — M545 Low back pain, unspecified: Secondary | ICD-10-CM | POA: Insufficient documentation

## 2014-04-14 DIAGNOSIS — M25519 Pain in unspecified shoulder: Secondary | ICD-10-CM | POA: Insufficient documentation

## 2014-04-14 DIAGNOSIS — Z8619 Personal history of other infectious and parasitic diseases: Secondary | ICD-10-CM | POA: Insufficient documentation

## 2014-04-14 DIAGNOSIS — Z8719 Personal history of other diseases of the digestive system: Secondary | ICD-10-CM | POA: Insufficient documentation

## 2014-04-14 DIAGNOSIS — M549 Dorsalgia, unspecified: Secondary | ICD-10-CM

## 2014-04-14 MED ORDER — CYCLOBENZAPRINE HCL 10 MG PO TABS
10.0000 mg | ORAL_TABLET | Freq: Once | ORAL | Status: AC
  Administered 2014-04-14: 10 mg via ORAL
  Filled 2014-04-14: qty 1

## 2014-04-14 MED ORDER — CYCLOBENZAPRINE HCL 10 MG PO TABS: 10.0000 mg | ORAL_TABLET | Freq: Two times a day (BID) | ORAL | Status: DC | PRN

## 2014-04-14 MED ORDER — NAPROXEN 500 MG PO TABS: 500.0000 mg | ORAL_TABLET | Freq: Two times a day (BID) | ORAL | Status: DC

## 2014-04-14 MED ORDER — NAPROXEN 500 MG PO TABS
500.0000 mg | ORAL_TABLET | Freq: Once | ORAL | Status: AC
Start: 2014-04-14 — End: 2014-04-14
  Administered 2014-04-14: 500 mg via ORAL
  Filled 2014-04-14: qty 1

## 2014-04-14 NOTE — ED Provider Notes (Signed)
Medical screening examination/treatment/procedure(s) were performed by non-physician practitioner and as supervising physician I was immediately available for consultation/collaboration.   EKG Interpretation None       Jules Baty M Lyndsay Talamante, MD 04/14/14 0552 

## 2014-04-14 NOTE — ED Provider Notes (Signed)
CSN: [id number]     Arrival date & time 04/14/14  0334 History   First MD Initiated Contact with Patient 04/14/14 0403     Chief Complaint  Patient presents with  . Shoulder Pain  . Back Pain     (Consider location/radiation/quality/duration/timing/severity/associated sxs/prior Treatment) HPI Comments: Patient is a 33 year old male who presents with back and left shoulder pain that started earlier this evening when he was being arrested for a "false charge." Patient reports sudden onset of left shoulder pain and lower back pain. The pain is aching and severe without radiation. Patient denies any injuries. Movement and palpation makes the pain worse. No alleviating factors. Patient did not try anything for pain relief.   Patient is a 33 y.o. male presenting with shoulder pain and back pain.  Shoulder Pain Pertinent negatives include no abdominal pain, arthralgias, chest pain, chills, fatigue, fever, nausea, neck pain, vomiting or weakness.  Back Pain Associated symptoms: no abdominal pain, no chest pain, no dysuria, no fever and no weakness     Past Medical History  Diagnosis Date  . Sarcoidosis   . GERD (gastroesophageal reflux disease)   . Mediastinal lymphadenopathy   . Hilar lymphadenopathy   . Tobacco abuse    Past Surgical History  Procedure Laterality Date  . Bronchoscopy     Family History  Problem Relation Age of Onset  . Coronary artery disease    . Heart attack Father     7149   History  Substance Use Topics  . Smoking status: Former Smoker    Quit date: 05/18/2011  . Smokeless tobacco: Not on file     Comment: 1-2 cigars a day x 10 years  . Alcohol Use: 3.2 oz/week    2 Cans of beer, 4 Drinks containing 0.5 oz of alcohol per week     Comment: on weekends    Review of Systems  Constitutional: Negative for fever, chills and fatigue.  HENT: Negative for trouble swallowing.   Eyes: Negative for visual disturbance.  Respiratory: Negative for shortness of  breath.   Cardiovascular: Negative for chest pain and palpitations.  Gastrointestinal: Negative for nausea, vomiting, abdominal pain and diarrhea.  Genitourinary: Negative for dysuria and difficulty urinating.  Musculoskeletal: Positive for back pain. Negative for arthralgias and neck pain.  Skin: Negative for color change.  Neurological: Negative for dizziness and weakness.  Psychiatric/Behavioral: Negative for dysphoric mood.      Allergies  Review of patient's allergies indicates no known allergies.  Home Medications   Prior to Admission medications   Medication Sig Start Date End Date Taking? Authorizing Provider  albuterol (PROVENTIL HFA;VENTOLIN HFA) 108 (90 BASE) MCG/ACT inhaler Inhale 2 puffs into the lungs every 6 (six) hours as needed for wheezing or shortness of breath.   Yes Historical Provider, MD   BP 118/84  Pulse 92  Temp(Src) 98.1 F (36.7 C) (Oral)  Resp 18  Ht 6' (1.829 m)  Wt 220 lb (99.791 kg)  BMI 29.83 kg/m2  SpO2 100% Physical Exam  Nursing note and vitals reviewed. Constitutional: He appears well-developed and well-nourished. No distress.  HENT:  Head: Normocephalic and atraumatic.  Eyes: Conjunctivae are normal.  Neck: Normal range of motion.  Cardiovascular: Normal rate and regular rhythm.  Exam reveals no gallop and no friction rub.   No murmur heard. Pulmonary/Chest: Effort normal and breath sounds normal. He has no wheezes. He has no rales. He exhibits no tenderness.  Abdominal: Soft. He exhibits no distension. There  is no tenderness. There is no rebound.  Musculoskeletal: Normal range of motion.  Bilateral paraspinal tenderness to palpation. No midline spinal tenderness to palpation.   Neurological: He is alert.  Speech is goal-oriented. Moves limbs without ataxia.   Skin: Skin is warm and dry.  Psychiatric: He has a normal mood and affect. His behavior is normal.    ED Course  Procedures (including critical care time) Labs  Review Labs Reviewed - No data to display  Imaging Review No results found.   EKG Interpretation None      MDM   Final diagnoses:  Back pain  Left shoulder pain    4:57 AM Patient has no bony tenderness to palpation. Patient likely has a muscle strain. Vitals stable and patient afebrile. Patient does not need xrays at this time. Patient will have naprosyn and flexeril for pain. No further evaluation needed at this time.     Emilia BeckKaitlyn Virgia Kelner, PA-C 04/14/14 [phone]
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
## 2014-04-14 NOTE — Discharge Instructions (Signed)
Take naprosyn for pain. Take Flexeril for muscle spasm. You may take these medications together. Refer to attached documents for more information.

## 2014-05-06 ENCOUNTER — Emergency Department (HOSPITAL_COMMUNITY): Payer: PRIVATE HEALTH INSURANCE

## 2014-05-06 ENCOUNTER — Encounter (HOSPITAL_COMMUNITY): Payer: Self-pay | Admitting: Emergency Medicine

## 2014-05-06 ENCOUNTER — Emergency Department (HOSPITAL_COMMUNITY)
Admission: EM | Admit: 2014-05-06 | Discharge: 2014-05-06 | Disposition: A | Discharge status: Home / Self Care | Payer: PRIVATE HEALTH INSURANCE | Attending: Emergency Medicine | Admitting: Emergency Medicine

## 2014-05-06 DIAGNOSIS — Z8619 Personal history of other infectious and parasitic diseases: Secondary | ICD-10-CM | POA: Insufficient documentation

## 2014-05-06 DIAGNOSIS — IMO0002 Reserved for concepts with insufficient information to code with codable children: Secondary | ICD-10-CM | POA: Insufficient documentation

## 2014-05-06 DIAGNOSIS — Z87891 Personal history of nicotine dependence: Secondary | ICD-10-CM | POA: Insufficient documentation

## 2014-05-06 DIAGNOSIS — S39011A Strain of muscle, fascia and tendon of abdomen, initial encounter: Secondary | ICD-10-CM | POA: Diagnosis present

## 2014-05-06 DIAGNOSIS — R079 Chest pain, unspecified: Secondary | ICD-10-CM | POA: Insufficient documentation

## 2014-05-06 DIAGNOSIS — Y929 Unspecified place or not applicable: Secondary | ICD-10-CM | POA: Insufficient documentation

## 2014-05-06 DIAGNOSIS — Y939 Activity, unspecified: Secondary | ICD-10-CM | POA: Insufficient documentation

## 2014-05-06 DIAGNOSIS — Z8719 Personal history of other diseases of the digestive system: Secondary | ICD-10-CM | POA: Insufficient documentation

## 2014-05-06 DIAGNOSIS — X58XXXA Exposure to other specified factors, initial encounter: Secondary | ICD-10-CM | POA: Insufficient documentation

## 2014-05-06 LAB — URINALYSIS, ROUTINE W REFLEX MICROSCOPIC
BILIRUBIN URINE: NEGATIVE
Glucose, UA: NEGATIVE mg/dL
HGB URINE DIPSTICK: NEGATIVE
Ketones, ur: NEGATIVE mg/dL
Leukocytes, UA: NEGATIVE
NITRITE: NEGATIVE
PROTEIN: NEGATIVE mg/dL
SPECIFIC GRAVITY, URINE: 1.027 (ref 1.005–1.030)
UROBILINOGEN UA: 1 mg/dL (ref 0.0–1.0)
pH: 6.5 (ref 5.0–8.0)

## 2014-05-06 MED ORDER — OXYCODONE-ACETAMINOPHEN 5-325 MG PO TABS: 1.0000 | ORAL_TABLET | Freq: Four times a day (QID) | ORAL | Status: DC | PRN

## 2014-05-06 MED ORDER — OXYCODONE-ACETAMINOPHEN 5-325 MG PO TABS
1.0000 | ORAL_TABLET | Freq: Once | ORAL | Status: AC
  Administered 2014-05-06: 1 via ORAL
  Filled 2014-05-06: qty 1

## 2014-05-06 NOTE — ED Notes (Signed)
Patient ambulatory to and from radiology without difficulty or assistance from nursing staff
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
## 2014-05-06 NOTE — ED Notes (Addendum)
Pt c/o left sided abd pain x 2 weeks, denies n/v/d. Last BM this morning.

## 2014-07-21 ENCOUNTER — Emergency Department (HOSPITAL_COMMUNITY)
Admission: EM | Admit: 2014-07-21 | Discharge: 2014-07-22 | Disposition: A | Discharge status: Home / Self Care | Payer: PRIVATE HEALTH INSURANCE | Attending: Emergency Medicine | Admitting: Emergency Medicine

## 2014-07-21 DIAGNOSIS — K5289 Other specified noninfective gastroenteritis and colitis: Secondary | ICD-10-CM | POA: Insufficient documentation

## 2014-07-21 DIAGNOSIS — Z8679 Personal history of other diseases of the circulatory system: Secondary | ICD-10-CM | POA: Insufficient documentation

## 2014-07-21 DIAGNOSIS — K529 Noninfective gastroenteritis and colitis, unspecified: Secondary | ICD-10-CM

## 2014-07-21 DIAGNOSIS — Z87891 Personal history of nicotine dependence: Secondary | ICD-10-CM | POA: Insufficient documentation

## 2014-07-21 DIAGNOSIS — R112 Nausea with vomiting, unspecified: Secondary | ICD-10-CM | POA: Insufficient documentation

## 2014-07-21 DIAGNOSIS — Z8619 Personal history of other infectious and parasitic diseases: Secondary | ICD-10-CM | POA: Insufficient documentation

## 2014-07-22 ENCOUNTER — Encounter (HOSPITAL_COMMUNITY): Payer: Self-pay | Admitting: Emergency Medicine

## 2014-07-22 LAB — COMPREHENSIVE METABOLIC PANEL
ALBUMIN: 3.7 g/dL (ref 3.5–5.2)
ALT: 14 U/L (ref 0–53)
AST: 17 U/L (ref 0–37)
Alkaline Phosphatase: 52 U/L (ref 39–117)
Anion gap: 11 (ref 5–15)
BUN: 16 mg/dL (ref 6–23)
CALCIUM: 9.1 mg/dL (ref 8.4–10.5)
CO2: 26 mEq/L (ref 19–32)
Chloride: 103 mEq/L (ref 96–112)
Creatinine, Ser: 0.93 mg/dL (ref 0.50–1.35)
GFR calc non Af Amer: >90 mL/min (ref >90)
GLUCOSE: 102 mg/dL — AB (ref 70–99)
POTASSIUM: 3.6 meq/L — AB (ref 3.7–5.3)
SODIUM: 140 meq/L (ref 137–147)
TOTAL PROTEIN: 6.7 g/dL (ref 6.0–8.3)
Total Bilirubin: 0.2 mg/dL — ABNORMAL LOW (ref 0.3–1.2)

## 2014-07-22 LAB — LIPASE, BLOOD: Lipase: 26 U/L (ref 11–59)

## 2014-07-22 LAB — CBC WITH DIFFERENTIAL/PLATELET
BASOS ABS: 0 10*3/uL (ref 0.0–0.1)
BASOS PCT: 0 % (ref 0–1)
EOS ABS: 0.1 10*3/uL (ref 0.0–0.7)
EOS PCT: 1 % (ref 0–5)
HCT: 41.1 % (ref 39.0–52.0)
Hemoglobin: 13.7 g/dL (ref 13.0–17.0)
Lymphocytes Relative: 41 % (ref 12–46)
Lymphs Abs: 3.4 10*3/uL (ref 0.7–4.0)
MCH: 29.8 pg (ref 26.0–34.0)
MCHC: 33.3 g/dL (ref 30.0–36.0)
MCV: 89.5 fL (ref 78.0–100.0)
Monocytes Absolute: 0.7 10*3/uL (ref 0.1–1.0)
Monocytes Relative: 8 % (ref 3–12)
NEUTROS PCT: 50 % (ref 43–77)
Neutro Abs: 4.1 10*3/uL (ref 1.7–7.7)
PLATELETS: 187 10*3/uL (ref 150–400)
RBC: 4.59 MIL/uL (ref 4.22–5.81)
RDW: 13.2 % (ref 11.5–15.5)
WBC: 8.3 10*3/uL (ref 4.0–10.5)

## 2014-07-22 MED ORDER — ONDANSETRON 4 MG PO TBDP: ORAL_TABLET | ORAL | Status: DC

## 2014-07-22 MED ORDER — SODIUM CHLORIDE 0.9 % IV BOLUS (SEPSIS)
1000.0000 mL | Freq: Once | INTRAVENOUS | Status: AC
  Administered 2014-07-22: 1000 mL via INTRAVENOUS

## 2014-07-22 MED ORDER — DICYCLOMINE HCL 20 MG PO TABS: 20.0000 mg | ORAL_TABLET | Freq: Two times a day (BID) | ORAL | Status: DC | PRN

## 2014-07-22 NOTE — Discharge Instructions (Signed)

## 2014-07-22 NOTE — ED Provider Notes (Signed)
CSN: [id number]     Arrival date & time 07/21/14  2337 History   First MD Initiated Contact with Patient 07/22/14 0020     Chief Complaint  Patient presents with  . Emesis     (Consider location/radiation/quality/duration/timing/severity/associated sxs/prior Treatment) HPI Patient states he's had 24 hours of vomiting and diarrhea. He states he's had multiple episodes each and does not recall the exact number. He complains of upper abdominal pain that is now improved. He has no fevers or chills. Denies any blood in the stool or vomit. 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 He's had no further vomiting in the emergency department. Past Medical History  Diagnosis Date  . Sarcoidosis   . GERD (gastroesophageal reflux disease)   . Mediastinal lymphadenopathy   . Hilar lymphadenopathy   . Tobacco abuse    Past Surgical History  Procedure Laterality Date  . Bronchoscopy     Family History  Problem Relation Age of Onset  . Coronary artery disease    . Heart attack Father     3149   History  Substance Use Topics  . Smoking status: Former Smoker    Quit date: 05/18/2011  . Smokeless tobacco: Not on file     Comment: 1-2 cigars a day x 10 years  . Alcohol Use: 3.2 oz/week    2 Cans of beer, 4 Drinks containing 0.5 oz of alcohol per week     Comment: on weekends    Review of Systems  Constitutional: Negative for fever and chills.  Respiratory: Negative for cough and shortness of breath.   Cardiovascular: Negative for chest pain.  Gastrointestinal: Positive for nausea, vomiting, abdominal pain and diarrhea. Negative for blood in stool.  Genitourinary: Negative for dysuria, frequency and flank pain.  Musculoskeletal: Negative for back pain, myalgias, neck pain and neck stiffness.  Skin: Negative for rash and wound.  Neurological: Negative for dizziness, weakness, light-headedness, numbness and headaches.  All other systems reviewed and are  negative.     Allergies  Review of patient's allergies indicates no known allergies.  Home Medications   Prior to Admission medications   Not on File   BP 112/68  Pulse 52  Temp(Src) 98.2 F (36.8 C) (Oral)  Resp 14  SpO2 98% Physical Exam  Nursing note and vitals reviewed. Constitutional: He is oriented to person, place, and time. He appears well-developed and well-nourished. No distress.  HENT:  Head: Normocephalic and atraumatic.  Mouth/Throat: Oropharynx is clear and moist.  Eyes: EOM are normal. Pupils are equal, round, and reactive to light.  Neck: Normal range of motion. Neck supple.  Cardiovascular: Normal rate and regular rhythm.   Pulmonary/Chest: Effort normal and breath sounds normal. No respiratory distress. He has no wheezes. He has no rales. He exhibits no tenderness.  Abdominal: Soft. Bowel sounds are normal. He exhibits no distension and no mass. There is tenderness (mild generalized tenderness especially in the epigastric region.). There is no rebound and no guarding.  Musculoskeletal: Normal range of motion. He exhibits no edema and no tenderness.  Neurological: He is alert and oriented to person, place, and time.  Skin: Skin is warm and dry. No rash noted. No erythema.  Psychiatric: He has a normal mood and affect. His behavior is normal.    ED Course  Procedures (including critical care time) Labs Review Labs Reviewed  CBC WITH DIFFERENTIAL  COMPREHENSIVE METABOLIC PANEL  URINALYSIS, ROUTINE W REFLEX MICROSCOPIC  LIPASE, BLOOD  Imaging Review No results found.   EKG Interpretation None      MDM   Final diagnoses:  None      Electrolytes within normal range. Vital signs remained stable. Abdominal exam is benign. Likely viral gastroenteritis versus food borne illness. Return precautions given.  Loren Racer, MD 07/22/14 [phone]

## 2014-07-22 NOTE — ED Notes (Signed)
Pt arrived to the ED with a complaint of emesis.  Pt has had 7 episodes of emesis today.  Pt states she has taken medication and feels better but wishes to be checked out

## 2014-07-27 ENCOUNTER — Encounter (HOSPITAL_COMMUNITY): Payer: Self-pay | Admitting: Emergency Medicine

## 2014-07-27 ENCOUNTER — Emergency Department (HOSPITAL_COMMUNITY)
Admission: EM | Admit: 2014-07-27 | Discharge: 2014-07-27 | Disposition: A | Discharge status: Home / Self Care | Payer: BC Managed Care – PPO | Attending: Emergency Medicine | Admitting: Emergency Medicine

## 2014-07-27 DIAGNOSIS — Z79899 Other long term (current) drug therapy: Secondary | ICD-10-CM | POA: Insufficient documentation

## 2014-07-27 DIAGNOSIS — Z792 Long term (current) use of antibiotics: Secondary | ICD-10-CM | POA: Insufficient documentation

## 2014-07-27 DIAGNOSIS — K089 Disorder of teeth and supporting structures, unspecified: Secondary | ICD-10-CM | POA: Insufficient documentation

## 2014-07-27 DIAGNOSIS — Z87891 Personal history of nicotine dependence: Secondary | ICD-10-CM | POA: Insufficient documentation

## 2014-07-27 DIAGNOSIS — Z8619 Personal history of other infectious and parasitic diseases: Secondary | ICD-10-CM | POA: Insufficient documentation

## 2014-07-27 DIAGNOSIS — K029 Dental caries, unspecified: Secondary | ICD-10-CM | POA: Insufficient documentation

## 2014-07-27 DIAGNOSIS — K0889 Other specified disorders of teeth and supporting structures: Secondary | ICD-10-CM

## 2014-07-27 DIAGNOSIS — Z8719 Personal history of other diseases of the digestive system: Secondary | ICD-10-CM | POA: Insufficient documentation

## 2014-07-27 MED ORDER — LIDOCAINE VISCOUS 2 % MT SOLN
15.0000 mL | Freq: Once | OROMUCOSAL | Status: AC
  Administered 2014-07-27: 15 mL via OROMUCOSAL
  Filled 2014-07-27: qty 15

## 2014-07-27 MED ORDER — AMOXICILLIN 500 MG PO CAPS: 500.0000 mg | ORAL_CAPSULE | Freq: Three times a day (TID) | ORAL | Status: DC

## 2014-07-27 MED ORDER — IBUPROFEN 800 MG PO TABS: 800.0000 mg | ORAL_TABLET | Freq: Three times a day (TID) | ORAL | Status: DC

## 2014-07-27 MED ORDER — TRAMADOL HCL 50 MG PO TABS: 50.0000 mg | ORAL_TABLET | Freq: Four times a day (QID) | ORAL | Status: DC | PRN

## 2014-07-27 MED ORDER — TRAMADOL HCL 50 MG PO TABS
50.0000 mg | ORAL_TABLET | Freq: Once | ORAL | Status: AC
  Administered 2014-07-27: 50 mg via ORAL
  Filled 2014-07-27: qty 1

## 2014-07-27 MED ORDER — LIDOCAINE VISCOUS 2 % MT SOLN: 20.0000 mL | OROMUCOSAL | Status: DC | PRN

## 2014-07-27 NOTE — Discharge Instructions (Signed)
Please follow up with your primary care physician in 1-2 days. If you do not have one please call the [facility] number listed above. Please follow up with a dentist from list below to schedule a follow up appointment.  Please take your antibiotic until completion. Please take pain medication and/or muscle relaxants as prescribed and as needed for pain. Please do not drive on narcotic pain medication or on muscle relaxants. Please read all discharge instructions and return precautions.    Dental Pain A tooth ache may be caused by cavities (tooth decay). Cavities expose the nerve of the tooth to air and hot or cold temperatures. It may come from an infection or abscess (also called a boil or furuncle) around your tooth. It is also often caused by dental caries (tooth decay). This causes the pain you are having. DIAGNOSIS  Your caregiver can diagnose this problem by exam. TREATMENT   If caused by an infection, it may be treated with medications which kill germs (antibiotics) and pain medications as prescribed by your caregiver. Take medications as directed.  Only take over-the-counter or prescription medicines for pain, discomfort, or fever as directed by your caregiver.  Whether the tooth ache today is caused by infection or dental disease, you should see your dentist as soon as possible for further care. SEEK MEDICAL CARE IF: The exam and treatment you received today has been provided on an emergency basis only. This is not a substitute for complete medical or dental care. If your problem worsens or new problems (symptoms) appear, and you are unable to meet with your dentist, call or return to this location. SEEK IMMEDIATE MEDICAL CARE IF:   You have a fever.  You develop redness and swelling of your face, jaw, or neck.  You are unable to open your mouth.  You have severe pain uncontrolled by pain medicine. MAKE SURE YOU:   Understand these instructions.  Will watch  your condition.  Will get help right away if you are not doing well or get worse. Document Released: 11/30/2005 Document Revised: 02/22/2012 Document Reviewed: 07/18/2008 [facility] Patient Information 2015 [employer] [place]. This information is not intended to replace advice given to you by your health care provider. Make sure you discuss any questions you have with your health care provider.  RESOURCE GUIDE  If you do not have a primary care doctor to follow up with regarding today's visit, please call the [facility] at [phone] to make an appointment. Hours of operation are 10am - 7pm, Monday through Friday, and they have a sliding scale fee.    Dental Assistance Please contact the on-call dentist listed on your discharge papers WITHIN 48 HOURS.  If the on-call dentist agrees that your condition is emergent, there is a CHANCE (not a guarantee) that you MAY receive a savings on your visit at this point. If you wait more than 48 hours, it will not be considered an emergency.   Drs. Moreen Fowleravid and Janna Civils:  [address], [place], [place], [id number], [phone]  Short-notice availability  Tooth evaluation: $100  Emergency Treatment: $200 (including exam, xrays, tooth extraction, and post-op visit)  Patients with Medicaid: [facility] [phone], [phone] [address], [phone]  If unable to pay, or uninsured, contact [facility] [phone] in [place], [phone] in [place]) to become qualified for the adult dental clinic  Other Low-Cost Community [facility] Services: - [facility]: [address] [place], [place], [address], [phone], Ext. [phone], 2nd and 4th Thursday of the month at 6:30am.  10 clients each day by appointment, can sometimes see walk-in patients if someone does not show for an appointment. [facility]:  [address] [place], [place], [address], [phone] [facility]:  [address] [place], [place], [address], [phone] [facility] Department:  [phone] [facility]:  [phone] [facility] Department:  [phone]

## 2014-07-27 NOTE — ED Notes (Signed)
Pt from home c/o R upper tooth pain that has been hurting x1 year, but worsened today. Pt is A&O and in NAD

## 2014-07-27 NOTE — ED Provider Notes (Signed)
CSN: [id number]     Arrival date & time 07/27/14  1150 History   This chart was scribed for non-physician practitioner, Tyler DeisJen Crystalee Ventress, PA-C, working with Raeford RazorStephen Kohut, MD by Milly JakobJohn Lee Graves, ED Scribe. The patient was seen in room WTR9/WTR9. Patient's care was started at 12:18 PM.  Chief Complaint  Patient presents with  . Dental Pain   The history is provided by the patient. No language interpreter was used.  HPI Comments: [name] is a 33 y.o. male who presents to the Emergency Department complaining of throbbing dental pain around a chipped tooth. He states that the pain is exacerbated by chewing. He reports taking Tylenol and narcotic pain medication w/ minimal relief. He reports a subjective fever. He reports concern that it is an abscess, and states that he has had a dental abscess in the passed. He denies swelling. He denies having a Education officer, communitydentist.    Past Medical History  Diagnosis Date  . Sarcoidosis   . GERD (gastroesophageal reflux disease)   . Mediastinal lymphadenopathy   . Hilar lymphadenopathy   . Tobacco abuse    Past Surgical History  Procedure Laterality Date  . Bronchoscopy     Family History  Problem Relation Age of Onset  . Coronary artery disease    . Heart attack Father     4149   History  Substance Use Topics  . Smoking status: Former Smoker    Quit date: 05/18/2011  . Smokeless tobacco: Not on file     Comment: 1-2 cigars a day x 10 years  . Alcohol Use: 3.2 oz/week    2 Cans of beer, 4 Drinks containing 0.5 oz of alcohol per week     Comment: on weekends    Review of Systems  HENT: Positive for dental problem.   All other systems reviewed and are negative.   Allergies  Review of patient's allergies indicates no known allergies.  Home Medications   Prior to Admission medications   Medication Sig Start Date End Date Taking? Authorizing Provider  acetaminophen (TYLENOL) 500 MG tablet Take 2,000 mg by mouth every 6 (six) hours as needed for  mild pain.   Yes Historical Provider, MD  ondansetron (ZOFRAN-ODT) 4 MG disintegrating tablet Take 4 mg by mouth every 4 (four) hours as needed for nausea or vomiting.   Yes Historical Provider, MD  amoxicillin (AMOXIL) 500 MG capsule Take 1 capsule (500 mg total) by mouth 3 (three) times daily. 07/27/14   Yulianna Folse L Edla Para, PA-C  dicyclomine (BENTYL) 20 MG tablet Take 1 tablet (20 mg total) by mouth 2 (two) times daily as needed for spasms. 07/22/14   Loren Raceravid Yelverton, MD  ibuprofen (ADVIL,MOTRIN) 800 MG tablet Take 1 tablet (800 mg total) by mouth 3 (three) times daily. 07/27/14   Bellanie Matthew L Marieelena Bartko, PA-C  lidocaine (XYLOCAINE) 2 % solution Use as directed 20 mLs in the mouth or throat as needed for mouth pain. 07/27/14   Topaz Raglin L Dariel Pellecchia, PA-C  traMADol (ULTRAM) 50 MG tablet Take 1 tablet (50 mg total) by mouth every 6 (six) hours as needed. 07/27/14   Dorissa Stinnette L Isaura Schiller, PA-C   Triage Vitals: BP 138/91  Pulse 58  Temp(Src) 99.9 F (37.7 C) (Oral)  Resp 20  SpO2 96% Physical Exam  Nursing note and vitals reviewed. Constitutional: He is oriented to person, place, and time. He appears well-developed and well-nourished. No distress.  HENT:  Head: Normocephalic and atraumatic.  Right Ear: External ear normal.  Left Ear:  External ear normal.  Nose: Nose normal.  Mouth/Throat: Uvula is midline, oropharynx is clear and moist and mucous membranes are normal. No oral lesions. No trismus in the jaw. Abnormal dentition. Dental caries present. No dental abscesses or uvula swelling.    Sublingual and submental spaces are soft.  Eyes: Conjunctivae are normal.  Neck: Normal range of motion. Neck supple.  Cardiovascular: Normal rate, regular rhythm and normal heart sounds.   Pulmonary/Chest: Effort normal.  Neurological: He is alert and oriented to person, place, and time.  Skin: Skin is warm and dry. He is not diaphoretic.  Psychiatric: He has a normal mood and affect.   ED Course   Procedures (including critical care time) Medications  lidocaine (XYLOCAINE) 2 % viscous mouth solution 15 mL (15 mLs Mouth/Throat Given 07/27/14 1254)  traMADol (ULTRAM) tablet 50 mg (50 mg Oral Given 07/27/14 1239)    12:22 PM-Discussed treatment plan which includes pain medication and ABX with pt at bedside and pt agreed to plan.   Labs Review Labs Reviewed - No data to display  Imaging Review No results found.   EKG Interpretation None      MDM   Final diagnoses:  Pain, dental    Filed Vitals:   07/27/14 1258  BP:   Pulse: 53  Temp:   Resp:    Afebrile, NAD, non-toxic appearing, AAOx4.  Patient with toothache.  No gross abscess.  Exam unconcerning for Ludwig's angina or spread of infection.  Will treat with Amoxil and pain medicine.  Urged patient to follow-up with dentist.  Return precautions discussed. Patient is agreeable to plan. Patient is stable at time of discharge   I personally performed the services described in this documentation, which was scribed in my presence. The recorded information has been reviewed and is accurate.    Jeannetta Ellis, PA-C 07/27/14 1408

## 2014-07-29 NOTE — ED Provider Notes (Signed)
Medical screening examination/treatment/procedure(s) were performed by non-physician practitioner and as supervising physician I was immediately available for consultation/collaboration.   EKG Interpretation None       Kaiea Esselman, MD 07/29/14 1413 

## 2015-08-10 ENCOUNTER — Encounter (HOSPITAL_COMMUNITY): Payer: Self-pay | Admitting: Emergency Medicine

## 2015-08-10 ENCOUNTER — Emergency Department (HOSPITAL_COMMUNITY)
Admission: EM | Admit: 2015-08-10 | Discharge: 2015-08-10 | Disposition: A | Discharge status: Home / Self Care | Payer: PRIVATE HEALTH INSURANCE | Attending: Emergency Medicine | Admitting: Emergency Medicine

## 2015-08-10 DIAGNOSIS — Z862 Personal history of diseases of the blood and blood-forming organs and certain disorders involving the immune mechanism: Secondary | ICD-10-CM | POA: Insufficient documentation

## 2015-08-10 DIAGNOSIS — R21 Rash and other nonspecific skin eruption: Secondary | ICD-10-CM

## 2015-08-10 DIAGNOSIS — Z87891 Personal history of nicotine dependence: Secondary | ICD-10-CM | POA: Insufficient documentation

## 2015-08-10 DIAGNOSIS — Z8719 Personal history of other diseases of the digestive system: Secondary | ICD-10-CM | POA: Insufficient documentation

## 2015-08-10 MED ORDER — HYDROXYZINE HCL 25 MG PO TABS: 25.0000 mg | ORAL_TABLET | Freq: Four times a day (QID) | ORAL | Status: DC | PRN

## 2015-08-10 MED ORDER — PERMETHRIN 5 % EX CREA: TOPICAL_CREAM | CUTANEOUS | Status: DC

## 2015-08-10 NOTE — ED Notes (Signed)
Patient complaining of rash. No rash or redness seen on patient. Patient has a few scratches on both arms. Patient stated he took benadryl 3 hours ago. Patient kept going to sleep while being accessed by nurse.

## 2015-08-10 NOTE — ED Notes (Addendum)
Pt from home  C/o itchy rash x 1 week. Scratch marks noted to arms. He reports using hydrocortisone 10 cream and benadryl without relief.

## 2015-08-10 NOTE — ED Provider Notes (Signed)
CSN: [id number]     Arrival date & time 08/10/15  7829 History   First MD Initiated Contact with Patient 08/10/15 [phone]     Chief Complaint  Patient presents with  . Rash    HPI Pt has had a rash for the last week.  He does not recall any exposure.  The rash is on his legs, arms and back.  It is very itchy.  He is constantly scratching.  No new meds.    Not working outside.  No one else with similar problems. Past Medical History  Diagnosis Date  . Sarcoidosis   . GERD (gastroesophageal reflux disease)   . Mediastinal lymphadenopathy   . Hilar lymphadenopathy   . Tobacco abuse    Past Surgical History  Procedure Laterality Date  . Bronchoscopy     Family History  Problem Relation Age of Onset  . Coronary artery disease    . Heart attack Father     48   Social History  Substance Use Topics  . Smoking status: Former Smoker    Quit date: 05/18/2011  . Smokeless tobacco: None     Comment: 1-2 cigars a day x 10 years  . Alcohol Use: 3.2 oz/week    2 Cans of beer, 4 Standard drinks or equivalent per week     Comment: on weekends    Review of Systems  All other systems reviewed and are negative.     Allergies  Review of patient's allergies indicates no known allergies.  Home Medications   Prior to Admission medications   Medication Sig Start Date End Date Taking? Authorizing Provider  diphenhydrAMINE (BENADRYL) 25 MG tablet Take 25 mg by mouth every 6 (six) hours as needed for itching or allergies.   Yes Historical Provider, MD  hydrocortisone cream 1 % Apply 1 application topically 2 (two) times daily as needed for itching.   Yes Historical Provider, MD  amoxicillin (AMOXIL) 500 MG capsule Take 1 capsule (500 mg total) by mouth 3 (three) times daily. Patient not taking: Reported on 08/10/2015 07/27/14   Francee Piccolo, PA-C  dicyclomine (BENTYL) 20 MG tablet Take 1 tablet (20 mg total) by mouth 2 (two) times daily as needed for spasms. Patient not taking: Reported  on 08/10/2015 07/22/14   Loren Racer, MD  hydrOXYzine (ATARAX/VISTARIL) 25 MG tablet Take 1 tablet (25 mg total) by mouth every 6 (six) hours as needed for itching. 08/10/15   Linwood Dibbles, MD  ibuprofen (ADVIL,MOTRIN) 800 MG tablet Take 1 tablet (800 mg total) by mouth 3 (three) times daily. Patient not taking: Reported on 08/10/2015 07/27/14   Francee Piccolo, PA-C  lidocaine (XYLOCAINE) 2 % solution Use as directed 20 mLs in the mouth or throat as needed for mouth pain. Patient not taking: Reported on 08/10/2015 07/27/14   Francee Piccolo, PA-C  permethrin (ELIMITE) 5 % cream Apply to affected area once from neck down at night, wash off in the am, repeat in 1 week 08/10/15   Linwood Dibbles, MD  traMADol (ULTRAM) 50 MG tablet Take 1 tablet (50 mg total) by mouth every 6 (six) hours as needed. Patient not taking: Reported on 08/10/2015 07/27/14   Victorino Dike Piepenbrink, PA-C   BP 141/91 mmHg  Pulse 56  Temp(Src) 98 F (36.7 C) (Oral)  Resp 18  SpO2 98% Physical Exam  Constitutional: He appears well-developed and well-nourished. No distress.  HENT:  Head: Normocephalic and atraumatic.  Right Ear: External ear normal.  Left Ear: External ear normal.  Eyes: Conjunctivae are normal. Right eye exhibits no discharge. Left eye exhibits no discharge. No scleral icterus.  Neck: Neck supple. No tracheal deviation present.  Cardiovascular: Normal rate.   Pulmonary/Chest: Effort normal. No stridor. No respiratory distress.  Musculoskeletal: He exhibits no edema.  Neurological: He is alert. Cranial nerve deficit: no gross deficits.  Skin: Skin is warm and dry. No bruising, no petechiae, no purpura and no rash noted. Rash is not pustular, not vesicular and not urticarial.  Multiple areas of excoriation on his lower extremities and his upper extremities, no distinct rash noted other than possibly some very fine papules  Psychiatric: He has a normal mood and affect.  Nursing note and vitals  reviewed.   ED Course  Procedures (including critical care time)   MDM   Final diagnoses:  Rash    The patient's rash is very pruritic. He seems to notice it more at night. He's tried hydrocortisone and Benadryl without relief.  The rash is very indistinct. I asked him about the possibility of bedbugs. Patient states that no way possible. I suggested he still take his mattress at home. I will try treatment for scabies considering the diffuse nature. Prescription for Vistaril for itching.  Discussed importance of outpatient follow-up with primary care doctor if the symptoms persist.    Linwood Dibbles, MD 08/10/15 0730

## 2015-08-10 NOTE — Discharge Instructions (Signed)
Scabies °Scabies are small bugs (mites) that burrow under the skin and cause red bumps and severe itching. These bugs can only be seen with a microscope. Scabies are highly contagious. They can spread easily from person to person by direct contact. They are also spread through sharing clothing or linens that have the scabies mites living in them. It is not unusual for an entire family to become infected through shared towels, clothing, or bedding.  °HOME CARE INSTRUCTIONS  °· Your caregiver may prescribe a cream or lotion to kill the mites. If cream is prescribed, massage the cream into the entire body from the neck to the bottom of both feet. Also massage the cream into the scalp and face if your child is less than 1 year old. Avoid the eyes and mouth. Do not wash your hands after application. °· Leave the cream on for 8 to 12 hours. Your child should bathe or shower after the 8 to 12 hour application period. Sometimes it is helpful to apply the cream to your child right before bedtime. °· One treatment is usually effective and will eliminate approximately 95% of infestations. For severe cases, your caregiver may decide to repeat the treatment in 1 week. Everyone in your household should be treated with one application of the cream. °· New rashes or burrows should not appear within 24 to 48 hours after successful treatment. However, the itching and rash may last for 2 to 4 weeks after successful treatment. Your caregiver may prescribe a medicine to help with the itching or to help the rash go away more quickly. °· Scabies can live on clothing or linens for up to 3 days. All of your child's recently used clothing, towels, stuffed toys, and bed linens should be washed in hot water and then dried in a dryer for at least 20 minutes on high heat. Items that cannot be washed should be enclosed in a plastic bag for at least 3 days. °· To help relieve itching, bathe your child in a cool bath or apply cool washcloths to the  affected areas. °· Your child may return to school after treatment with the prescribed cream. °SEEK MEDICAL CARE IF:  °· The itching persists longer than 4 weeks after treatment. °· The rash spreads or becomes infected. Signs of infection include red blisters or yellow-tan crust. °Document Released: 11/30/2005 Document Revised: 02/22/2012 Document Reviewed: 04/10/2009 °ExitCare® Patient Information ©2015 [employer]. This information is not intended to replace advice given to you by your health care provider. Make sure you discuss any questions you have with your health care provider. ° °Rash °A rash is a change in the color or texture of your skin. There are many different types of rashes. You may have other problems that accompany your rash. °CAUSES  °· Infections. °· Allergic reactions. This can include allergies to pets or foods. °· Certain medicines. °· Exposure to certain chemicals, soaps, or cosmetics. °· Heat. °· Exposure to poisonous plants. °· Tumors, both cancerous and noncancerous. °SYMPTOMS  °· Redness. °· Scaly skin. °· Itchy skin. °· Dry or cracked skin. °· Bumps. °· Blisters. °· Pain. °DIAGNOSIS  °Your caregiver may do a physical exam to determine what type of rash you have. A skin sample (biopsy) may be taken and examined under a microscope. °TREATMENT  °Treatment depends on the type of rash you have. Your caregiver may prescribe certain medicines. For serious conditions, you may need to see a skin doctor (dermatologist). °HOME CARE INSTRUCTIONS  °· Avoid   the substance that caused your rash. °· Do not scratch your rash. This can cause infection. °· You may take cool baths to help stop itching. °· Only take over-the-counter or prescription medicines as directed by your caregiver. °· Keep all follow-up appointments as directed by your caregiver. °SEEK IMMEDIATE MEDICAL CARE IF: °· You have increasing pain, swelling, or redness. °· You have a fever. °· You have new or severe symptoms. °· You have  body aches, diarrhea, or vomiting. °· Your rash is not better after 3 days. °MAKE SURE YOU: °· Understand these instructions. °· Will watch your condition. °· Will get help right away if you are not doing well or get worse. °Document Released: 11/20/2002 Document Revised: 02/22/2012 Document Reviewed: 09/14/2011 °ExitCare® Patient Information ©2015 [employer]. This information is not intended to replace advice given to you by your health care provider. Make sure you discuss any questions you have with your health care provider. ° °

## 2015-08-19 ENCOUNTER — Emergency Department (HOSPITAL_COMMUNITY)
Admission: EM | Admit: 2015-08-19 | Discharge: 2015-08-19 | Disposition: A | Discharge status: Home / Self Care | Payer: PRIVATE HEALTH INSURANCE | Attending: Emergency Medicine | Admitting: Emergency Medicine

## 2015-08-19 ENCOUNTER — Emergency Department (HOSPITAL_COMMUNITY): Payer: PRIVATE HEALTH INSURANCE

## 2015-08-19 ENCOUNTER — Encounter (HOSPITAL_COMMUNITY): Payer: Self-pay | Admitting: Emergency Medicine

## 2015-08-19 DIAGNOSIS — Z87891 Personal history of nicotine dependence: Secondary | ICD-10-CM | POA: Insufficient documentation

## 2015-08-19 DIAGNOSIS — M25532 Pain in left wrist: Secondary | ICD-10-CM | POA: Insufficient documentation

## 2015-08-19 DIAGNOSIS — Z862 Personal history of diseases of the blood and blood-forming organs and certain disorders involving the immune mechanism: Secondary | ICD-10-CM | POA: Insufficient documentation

## 2015-08-19 DIAGNOSIS — Z8719 Personal history of other diseases of the digestive system: Secondary | ICD-10-CM | POA: Insufficient documentation

## 2015-08-19 MED ORDER — TRAMADOL HCL 50 MG PO TABS
50.0000 mg | ORAL_TABLET | Freq: Once | ORAL | Status: AC
  Administered 2015-08-19: 50 mg via ORAL
  Filled 2015-08-19: qty 1

## 2015-08-19 MED ORDER — IBUPROFEN 800 MG PO TABS
800.0000 mg | ORAL_TABLET | Freq: Once | ORAL | Status: AC
  Administered 2015-08-19: 800 mg via ORAL
  Filled 2015-08-19: qty 1

## 2015-08-19 MED ORDER — IBUPROFEN 800 MG PO TABS: 800.0000 mg | ORAL_TABLET | Freq: Three times a day (TID) | ORAL | Status: DC

## 2015-08-19 MED ORDER — TRAMADOL HCL 50 MG PO TABS: 50.0000 mg | ORAL_TABLET | Freq: Four times a day (QID) | ORAL | Status: DC | PRN

## 2015-08-19 NOTE — ED Notes (Signed)
Pt says upon this RN entering the room, "I woke up like this and y'all going to have to give me something for this pain. I need something quick so I can go." Says he woke up with left wrist swelling and pain. Reports previous injury to left wrist x2 but "never got it checked out." Has stabilization apparatus on left wrist. Patient is writhing all over the triage bed. Informed he has to be assessed before or if he receives pain medicine.

## 2015-08-19 NOTE — ED Provider Notes (Signed)
CSN: [id number]     Arrival date & time 08/19/15  2133 History  This chart was scribed for Elpidio Anis, PA-C, working with Marily Memos, MD by Elon Spanner, ED Scribe. This patient was seen in room WTR7/WTR7 and the patient's care was started at 9:55 PM.   Chief Complaint  Patient presents with  . Wrist Injury   The history is provided by the patient. No language interpreter was used.   HPI Comments: [name] is a 34 y.o. male who presents to the Emergency Department complaining of constant, unchanged, severe left wrist pain and moderate swelling onset this morning upon waking without known injury.  Patient is using a wrist brace that his friend gave him.  He denies other complaints.  He denies neck pain, right arm pain.     Past Medical History  Diagnosis Date  . Sarcoidosis   . GERD (gastroesophageal reflux disease)   . Mediastinal lymphadenopathy   . Hilar lymphadenopathy   . Tobacco abuse    Past Surgical History  Procedure Laterality Date  . Bronchoscopy     Family History  Problem Relation Age of Onset  . Coronary artery disease    . Heart attack Father     58   Social History  Substance Use Topics  . Smoking status: Former Smoker    Quit date: 05/18/2011  . Smokeless tobacco: Not on file     Comment: 1-2 cigars a day x 10 years  . Alcohol Use: 3.2 oz/week    2 Cans of beer, 4 Standard drinks or equivalent per week     Comment: on weekends    Review of Systems  Musculoskeletal: Positive for joint swelling and arthralgias.      Allergies  Review of patient's allergies indicates no known allergies.  Home Medications   Prior to Admission medications   Medication Sig Start Date End Date Taking? Authorizing Provider  amoxicillin (AMOXIL) 500 MG capsule Take 1 capsule (500 mg total) by mouth 3 (three) times daily. Patient not taking: Reported on 08/10/2015 07/27/14   Francee Piccolo, PA-C  dicyclomine (BENTYL) 20 MG tablet Take 1 tablet (20 mg total)  by mouth 2 (two) times daily as needed for spasms. Patient not taking: Reported on 08/10/2015 07/22/14   Loren Racer, MD  diphenhydrAMINE (BENADRYL) 25 MG tablet Take 25 mg by mouth every 6 (six) hours as needed for itching or allergies.    Historical Provider, MD  hydrocortisone cream 1 % Apply 1 application topically 2 (two) times daily as needed for itching.    Historical Provider, MD  hydrOXYzine (ATARAX/VISTARIL) 25 MG tablet Take 1 tablet (25 mg total) by mouth every 6 (six) hours as needed for itching. 08/10/15   Linwood Dibbles, MD  ibuprofen (ADVIL,MOTRIN) 800 MG tablet Take 1 tablet (800 mg total) by mouth 3 (three) times daily. Patient not taking: Reported on 08/10/2015 07/27/14   Francee Piccolo, PA-C  lidocaine (XYLOCAINE) 2 % solution Use as directed 20 mLs in the mouth or throat as needed for mouth pain. Patient not taking: Reported on 08/10/2015 07/27/14   Francee Piccolo, PA-C  permethrin (ELIMITE) 5 % cream Apply to affected area once from neck down at night, wash off in the am, repeat in 1 week 08/10/15   Linwood Dibbles, MD  traMADol (ULTRAM) 50 MG tablet Take 1 tablet (50 mg total) by mouth every 6 (six) hours as needed. Patient not taking: Reported on 08/10/2015 07/27/14   Francee Piccolo, PA-C   BP  126/94 mmHg  Pulse 84  Temp(Src) 98.5 F (36.9 C) (Oral)  Resp 16  SpO2 98% Physical Exam  Constitutional: He is oriented to person, place, and time. He appears well-developed and well-nourished. No distress.  HENT:  Head: Normocephalic and atraumatic.  Eyes: Conjunctivae and EOM are normal.  Neck: Neck supple. No tracheal deviation present.  Cardiovascular: Normal rate.   Pulmonary/Chest: Effort normal. No respiratory distress.  Musculoskeletal: Normal range of motion.  Left wrist splinted with velcro splint. Patient asked to removed splint secondary to pain, agitation and resistance when I attempt. Wrist is mildly swollen without redness or warmth. FROM all digits, full  painful ROM of wrist. No forearm swelling or tenderness.   Neurological: He is alert and oriented to person, place, and time.  Skin: Skin is warm and dry.  Psychiatric: He has a normal mood and affect. His behavior is normal.  Nursing note and vitals reviewed.   ED Course  Procedures (including critical care time)  DIAGNOSTIC STUDIES: Oxygen Saturation is 98% on RA, normal by my interpretation.    COORDINATION OF CARE:  10:01 PM Discussed treatment plan with patient at bedside.  Patient acknowledges and agrees with plan.    Labs Review Labs Reviewed - No data to display  Imaging Review No results found. I have personally reviewed and evaluated these images and lab results as part of my medical decision-making.   EKG Interpretation None      MDM   Final diagnoses:  None    1. Left wrist pain  Stronger velcro splint provided for comfort. Ibuprofen gave some relief and patient appears more comfortable. Will provide ortho referral if needed for persistent symptoms.    I personally performed the services described in this documentation, which was scribed in my presence. The recorded information has been reviewed and is accurate.     Elpidio Anis, PA-C 08/21/15 0032  Marily Memos, MD 08/21/15 [phone]

## 2015-08-19 NOTE — Discharge Instructions (Signed)
Cryotherapy °Cryotherapy means treatment with cold. Ice or gel packs can be used to reduce both pain and swelling. Ice is the most helpful within the first 24 to 48 hours after an injury or flare-up from overusing a muscle or joint. Sprains, strains, spasms, burning pain, shooting pain, and aches can all be eased with ice. Ice can also be used when recovering from surgery. Ice is effective, has very few side effects, and is safe for most people to use. °PRECAUTIONS  °Ice is not a safe treatment option for people with: °· Raynaud phenomenon. This is a condition affecting small blood vessels in the extremities. Exposure to cold may cause your problems to return. °· Cold hypersensitivity. There are many forms of cold hypersensitivity, including: °¨ Cold urticaria. Red, itchy hives appear on the skin when the tissues begin to warm after being iced. °¨ Cold erythema. This is a red, itchy rash caused by exposure to cold. °¨ Cold hemoglobinuria. Red blood cells break down when the tissues begin to warm after being iced. The hemoglobin that carry oxygen are passed into the urine because they cannot combine with blood proteins fast enough. °· Numbness or altered sensitivity in the area being iced. °If you have any of the following conditions, do not use ice until you have discussed cryotherapy with your caregiver: °· Heart conditions, such as arrhythmia, angina, or chronic heart disease. °· High blood pressure. °· Healing wounds or open skin in the area being iced. °· Current infections. °· Rheumatoid arthritis. °· Poor circulation. °· Diabetes. °Ice slows the blood flow in the region it is applied. This is beneficial when trying to stop inflamed tissues from spreading irritating chemicals to surrounding tissues. However, if you expose your skin to cold temperatures for too long or without the proper protection, you can damage your skin or nerves. Watch for signs of skin damage due to cold. °HOME CARE INSTRUCTIONS °Follow  these tips to use ice and cold packs safely. °· Place a dry or damp towel between the ice and skin. A damp towel will cool the skin more quickly, so you may need to shorten the time that the ice is used. °· For a more rapid response, add gentle compression to the ice. °· Ice for no more than 10 to 20 minutes at a time. The bonier the area you are icing, the less time it will take to get the benefits of ice. °· Check your skin after 5 minutes to make sure there are no signs of a poor response to cold or skin damage. °· Rest 20 minutes or more between uses. °· Once your skin is numb, you can end your treatment. You can test numbness by very lightly touching your skin. The touch should be so light that you do not see the skin dimple from the pressure of your fingertip. When using ice, most people will feel these normal sensations in this order: cold, burning, aching, and numbness. °· Do not use ice on someone who cannot communicate their responses to pain, such as small children or people with dementia. °HOW TO MAKE AN ICE PACK °Ice packs are the most common way to use ice therapy. Other methods include ice massage, ice baths, and cryosprays. Muscle creams that cause a cold, tingly feeling do not offer the same benefits that ice offers and should not be used as a substitute unless recommended by your caregiver. °To make an ice pack, do one of the following: °· Place crushed ice or a   bag of frozen vegetables in a sealable plastic bag. Squeeze out the excess air. Place this bag inside another plastic bag. Slide the bag into a pillowcase or place a damp towel between your skin and the bag. °· Mix 3 parts water with 1 part rubbing alcohol. Freeze the mixture in a sealable plastic bag. When you remove the mixture from the freezer, it will be slushy. Squeeze out the excess air. Place this bag inside another plastic bag. Slide the bag into a pillowcase or place a damp towel between your skin and the bag. °SEEK MEDICAL CARE  IF: °· You develop white spots on your skin. This may give the skin a blotchy (mottled) appearance. °· Your skin turns blue or pale. °· Your skin becomes waxy or hard. °· Your swelling gets worse. °MAKE SURE YOU:  °· Understand these instructions. °· Will watch your condition. °· Will get help right away if you are not doing well or get worse. °Document Released: 07/27/2011 Document Revised: 04/16/2014 Document Reviewed: 07/27/2011 °ExitCare® Patient Information ©2015 [employer]. This information is not intended to replace advice given to you by your health care provider. Make sure you discuss any questions you have with your health care provider. ° °Wrist Pain °Wrist injuries are frequent in adults and children. A sprain is an injury to the ligaments that hold your bones together. A strain is an injury to muscle or muscle cord-like structures (tendons) from stretching or pulling. Generally, when wrists are moderately tender to touch following a fall or injury, a break in the bone (fracture) may be present. Most wrist sprains or strains are better in 3 to 5 days, but complete healing may take several weeks. °HOME CARE INSTRUCTIONS  °· Put ice on the injured area. °¨ Put ice in a plastic bag. °¨ Place a towel between your skin and the bag. °¨ Leave the ice on for 15-20 minutes, 3-4 times a day, for the first 2 days, or as directed by your health care provider. °· Keep your arm raised above the level of your heart whenever possible to reduce swelling and pain. °· Rest the injured area for at least 48 hours or as directed by your health care provider. °· If a splint or elastic bandage has been applied, use it for as long as directed by your health care provider or until seen by a health care provider for a follow-up exam. °· Only take over-the-counter or prescription medicines for pain, discomfort, or fever as directed by your health care provider. °· Keep all follow-up appointments. You may need to follow up with a  specialist or have follow-up X-rays. Improvement in pain level is not a guarantee that you did not fracture a bone in your wrist. The only way to determine whether or not you have a broken bone is by X-ray. °SEEK IMMEDIATE MEDICAL CARE IF:  °· Your fingers are swollen, very red, white, or cold and blue. °· Your fingers are numb or tingling. °· You have increasing pain. °· You have difficulty moving your fingers. °MAKE SURE YOU:  °· Understand these instructions. °· Will watch your condition. °· Will get help right away if you are not doing well or get worse. °Document Released: 09/09/2005 Document Revised: 12/05/2013 Document Reviewed: 01/21/2011 °ExitCare® Patient Information ©2015 [employer]. This information is not intended to replace advice given to you by your health care provider. Make sure you discuss any questions you have with your health care provider. ° °

## 2016-01-16 ENCOUNTER — Emergency Department (HOSPITAL_COMMUNITY)
Admission: EM | Admit: 2016-01-16 | Discharge: 2016-01-16 | Disposition: A | Discharge status: Other Acute Inpatient Hospital | Payer: PRIVATE HEALTH INSURANCE | Attending: Emergency Medicine | Admitting: Emergency Medicine

## 2016-01-16 ENCOUNTER — Inpatient Hospital Stay (HOSPITAL_COMMUNITY)
Admission: AD | Admit: 2016-01-16 | Discharge: 2016-01-18 | DRG: 885 | Disposition: A | Discharge status: Home / Self Care | Payer: PRIVATE HEALTH INSURANCE | Source: Intra-hospital | Attending: Psychiatry | Admitting: Psychiatry

## 2016-01-16 ENCOUNTER — Encounter (HOSPITAL_COMMUNITY): Payer: Self-pay | Admitting: Emergency Medicine

## 2016-01-16 ENCOUNTER — Encounter (HOSPITAL_COMMUNITY): Payer: Self-pay | Admitting: *Deleted

## 2016-01-16 DIAGNOSIS — R45851 Suicidal ideations: Secondary | ICD-10-CM | POA: Diagnosis present

## 2016-01-16 DIAGNOSIS — F332 Major depressive disorder, recurrent severe without psychotic features: Secondary | ICD-10-CM | POA: Diagnosis present

## 2016-01-16 DIAGNOSIS — F329 Major depressive disorder, single episode, unspecified: Secondary | ICD-10-CM | POA: Diagnosis present

## 2016-01-16 DIAGNOSIS — Z87891 Personal history of nicotine dependence: Secondary | ICD-10-CM | POA: Insufficient documentation

## 2016-01-16 DIAGNOSIS — F322 Major depressive disorder, single episode, severe without psychotic features: Secondary | ICD-10-CM | POA: Diagnosis present

## 2016-01-16 DIAGNOSIS — Z8719 Personal history of other diseases of the digestive system: Secondary | ICD-10-CM | POA: Insufficient documentation

## 2016-01-16 DIAGNOSIS — F1210 Cannabis abuse, uncomplicated: Secondary | ICD-10-CM | POA: Insufficient documentation

## 2016-01-16 DIAGNOSIS — Z862 Personal history of diseases of the blood and blood-forming organs and certain disorders involving the immune mechanism: Secondary | ICD-10-CM | POA: Insufficient documentation

## 2016-01-16 DIAGNOSIS — Z791 Long term (current) use of non-steroidal anti-inflammatories (NSAID): Secondary | ICD-10-CM | POA: Insufficient documentation

## 2016-01-16 DIAGNOSIS — F330 Major depressive disorder, recurrent, mild: Secondary | ICD-10-CM | POA: Diagnosis not present

## 2016-01-16 DIAGNOSIS — F1220 Cannabis dependence, uncomplicated: Secondary | ICD-10-CM | POA: Diagnosis present

## 2016-01-16 LAB — COMPREHENSIVE METABOLIC PANEL
ALT: 16 U/L — ABNORMAL LOW (ref 17–63)
ANION GAP: 11 (ref 5–15)
AST: 20 U/L (ref 15–41)
Albumin: 4.7 g/dL (ref 3.5–5.0)
Alkaline Phosphatase: 49 U/L (ref 38–126)
BUN: 14 mg/dL (ref 6–20)
CHLORIDE: 103 mmol/L (ref 101–111)
CO2: 25 mmol/L (ref 22–32)
CREATININE: 1 mg/dL (ref 0.61–1.24)
Calcium: 9.8 mg/dL (ref 8.9–10.3)
Glucose, Bld: 101 mg/dL — ABNORMAL HIGH (ref 65–99)
Potassium: 3.9 mmol/L (ref 3.5–5.1)
SODIUM: 139 mmol/L (ref 135–145)
Total Bilirubin: 1.2 mg/dL (ref 0.3–1.2)
Total Protein: 7.9 g/dL (ref 6.5–8.1)

## 2016-01-16 LAB — CBC
HCT: 49.1 % (ref 39.0–52.0)
HEMOGLOBIN: 16.4 g/dL (ref 13.0–17.0)
MCH: 30.5 pg (ref 26.0–34.0)
MCHC: 33.4 g/dL (ref 30.0–36.0)
MCV: 91.3 fL (ref 78.0–100.0)
PLATELETS: 198 10*3/uL (ref 150–400)
RBC: 5.38 MIL/uL (ref 4.22–5.81)
RDW: 13.8 % (ref 11.5–15.5)
WBC: 5.6 10*3/uL (ref 4.0–10.5)

## 2016-01-16 LAB — RAPID URINE DRUG SCREEN, HOSP PERFORMED
AMPHETAMINES: NOT DETECTED
Barbiturates: NOT DETECTED
Benzodiazepines: NOT DETECTED
COCAINE: NOT DETECTED
OPIATES: NOT DETECTED
TETRAHYDROCANNABINOL: POSITIVE — AB

## 2016-01-16 LAB — ETHANOL

## 2016-01-16 LAB — ACETAMINOPHEN LEVEL

## 2016-01-16 LAB — SALICYLATE LEVEL

## 2016-01-16 MED ORDER — ALUM & MAG HYDROXIDE-SIMETH 200-200-20 MG/5ML PO SUSP: 30.0000 mL | ORAL | Status: DC | PRN

## 2016-01-16 MED ORDER — ACETAMINOPHEN 325 MG PO TABS: 650.0000 mg | ORAL_TABLET | Freq: Four times a day (QID) | ORAL | Status: DC | PRN

## 2016-01-16 MED ORDER — FLUOXETINE HCL 10 MG PO CAPS
10.0000 mg | ORAL_CAPSULE | Freq: Every day | ORAL | Status: DC
  Filled 2016-01-16: qty 1

## 2016-01-16 MED ORDER — HYDROXYZINE HCL 25 MG PO TABS: 25.0000 mg | ORAL_TABLET | Freq: Four times a day (QID) | ORAL | Status: DC | PRN

## 2016-01-16 MED ORDER — TRAZODONE HCL 50 MG PO TABS: 50.0000 mg | ORAL_TABLET | Freq: Every evening | ORAL | Status: DC | PRN

## 2016-01-16 MED ORDER — MAGNESIUM HYDROXIDE 400 MG/5ML PO SUSP: 30.0000 mL | Freq: Every day | ORAL | Status: DC | PRN

## 2016-01-16 MED ORDER — TRAZODONE HCL 50 MG PO TABS
50.0000 mg | ORAL_TABLET | Freq: Every evening | ORAL | Status: DC | PRN
  Administered 2016-01-16 – 2016-01-17 (×3): 50 mg via ORAL
  Filled 2016-01-16 (×2): qty 1

## 2016-01-16 NOTE — ED Notes (Signed)
Pt from a bridge. Pt brought here by [employer] with thoughts of jumping off the bridge. Pt is voluntary. Pt states he hasn't eaten in a "few days because he hasnt felt like eating.

## 2016-01-16 NOTE — ED Notes (Addendum)
Patient states that he feels like he puts his everything into the people that he loves, but that the people he loves do not love him back. Reports feelings embarrassed about thinking about hurting himself. Per [employer], patient is currently voluntarily committed. Patient's brother said he was going to fill out IVC paperwork, but has not shown up to fill out the paperwork as of yet Patient aware of urine sample and blood work that is needed.

## 2016-01-16 NOTE — ED Notes (Signed)
Patient was to be transferred voluntarily to [facility], but his comments of wanting to leave and his escalating behaviors have made it necessary for [place] to take out IVC paperwork.  Patient is angry and has been on the phone several times.  He has been verbally abusive and does not seem to fully understand the IVC process.  He was started to fluoxetine, but refused it when I brought it to him.

## 2016-01-16 NOTE — ED Notes (Signed)
Patient and belongings wanded by security. Belongings include: pair of green/blue sleep pants, black jacket, cell phone, cell phone charger.

## 2016-01-16 NOTE — ED Notes (Signed)
Patient transferred to [facility].  All belongings given to [employer] who were transporting patient.  He is now under IVC.

## 2016-01-16 NOTE — Progress Notes (Signed)
D:Patient in the dayroom on approach.  Patient states, "I do not need to be here."  Patient appears to be minimizes suicidal gestures prior to admission.  Patient states, "I was just mad at my brother."  Patient does not appear to have insight at this time.  Patient states he needs to get back to work.  Patient denies SI/HI and denies AVH. A: Staff to monitor Q 15 mins for safety.  Encouragement and support offered. No scheduled medications administered per orders.  Trazodone administered prn for sleep. R: Patient remains safe on the unit.  Patient attended group tonight.  Patient visible on the unit and interacting with peers.  Patient taking administered medications.

## 2016-01-16 NOTE — ED Provider Notes (Signed)
CSN: [id number]     Arrival date & time 01/16/16  4098 History   First MD Initiated Contact with Patient 01/16/16 [phone]     Chief Complaint  Patient presents with  . Suicidal     (Consider location/radiation/quality/duration/timing/severity/associated sxs/prior Treatment) HPI Comments: 35 year old male with sarcoidosis and GERD who presents with suicidal ideation. Patient was brought in by PD after he was found [place] a bridge stating that he was thinking of jumping off a bridge. He stated that he had not eaten in a few days because he "hadn't felt like eating." He states that he has no history of depression but has been depressed for the past several weeks with SI because all the people who he loves have let him down and he has struggled with unemployment despite trying to get a job. He denies any HI or hallucinations. He endorses marijuana use but denies any other drug use. He denies any complaints at this time.  The history is provided by the patient.    Past Medical History  Diagnosis Date  . Sarcoidosis (HCC)   . GERD (gastroesophageal reflux disease)   . Mediastinal lymphadenopathy   . Hilar lymphadenopathy   . Tobacco abuse    Past Surgical History  Procedure Laterality Date  . Bronchoscopy     Family History  Problem Relation Age of Onset  . Coronary artery disease    . Heart attack Father     47   Social History  Substance Use Topics  . Smoking status: Former Smoker    Quit date: 05/18/2011  . Smokeless tobacco: None     Comment: 1-2 cigars a day x 10 years  . Alcohol Use: No     Comment: on weekends    Review of Systems 10 Systems reviewed and are negative for acute change except as noted in the HPI.    Allergies  Review of patient's allergies indicates no known allergies.  Home Medications   Prior to Admission medications   Medication Sig Start Date End Date Taking? Authorizing Provider  amoxicillin (AMOXIL) 500 MG capsule Take 1 capsule (500 mg total) by  mouth 3 (three) times daily. Patient not taking: Reported on 08/10/2015 07/27/14   Francee Piccolo, PA-C  dicyclomine (BENTYL) 20 MG tablet Take 1 tablet (20 mg total) by mouth 2 (two) times daily as needed for spasms. Patient not taking: Reported on 08/10/2015 07/22/14   Loren Racer, MD  diphenhydrAMINE (BENADRYL) 25 MG tablet Take 25 mg by mouth every 6 (six) hours as needed for itching or allergies.    Historical Provider, MD  hydrocortisone cream 1 % Apply 1 application topically 2 (two) times daily as needed for itching.    Historical Provider, MD  hydrOXYzine (ATARAX/VISTARIL) 25 MG tablet Take 1 tablet (25 mg total) by mouth every 6 (six) hours as needed for itching. 08/10/15   Linwood Dibbles, MD  ibuprofen (ADVIL,MOTRIN) 800 MG tablet Take 1 tablet (800 mg total) by mouth 3 (three) times daily. 08/19/15   Elpidio Anis, PA-C  lidocaine (XYLOCAINE) 2 % solution Use as directed 20 mLs in the mouth or throat as needed for mouth pain. Patient not taking: Reported on 08/10/2015 07/27/14   Francee Piccolo, PA-C  permethrin (ELIMITE) 5 % cream Apply to affected area once from neck down at night, wash off in the am, repeat in 1 week 08/10/15   Linwood Dibbles, MD  traMADol (ULTRAM) 50 MG tablet Take 1 tablet (50 mg total) by mouth every 6 (six)  hours as needed. 08/19/15   Shari Upstill, PA-C   BP 119/91 mmHg  Pulse 71  Temp(Src) 97.4 F (36.3 C) (Oral)  Resp 16  Ht 6' (1.829 m)  Wt 190 lb (86.183 kg)  BMI 25.76 kg/m2  SpO2 100% Physical Exam  Constitutional: He is oriented to person, place, and time. He appears well-developed and well-nourished. No distress.  HENT:  Head: Normocephalic and atraumatic.  Moist mucous membranes  Eyes: Conjunctivae are normal. Pupils are equal, round, and reactive to light.  Neck: Neck supple.  Cardiovascular: Normal rate, regular rhythm and normal heart sounds.   No murmur heard. Pulmonary/Chest: Effort normal and breath sounds normal.  Abdominal: Soft. Bowel  sounds are normal. He exhibits no distension. There is no tenderness.  Musculoskeletal: He exhibits no edema.  Neurological: He is alert and oriented to person, place, and time.  Fluent speech  Skin: Skin is warm and dry.  Psychiatric:  Depressed mood, flat affect, avoids eye contact  Nursing note and vitals reviewed.   ED Course  Procedures (including critical care time) Labs Review Labs Reviewed  COMPREHENSIVE METABOLIC PANEL - Abnormal; Notable for the following:    Glucose, Bld 101 (*)    ALT 16 (*)    All other components within normal limits  ACETAMINOPHEN LEVEL - Abnormal; Notable for the following:    Acetaminophen (Tylenol), Serum <10 (*)    All other components within normal limits  ETHANOL  SALICYLATE LEVEL  CBC  URINE RAPID DRUG SCREEN, HOSP PERFORMED    Imaging Review No results found. I have personally reviewed and evaluated these lab results as part of my medical decision-making.   EKG Interpretation None      MDM   Final diagnoses:  Suicidal ideation   Patient brought in by PD for suicidal ideation when he was standing at a bridge. Patient was awake, alert, no acute distress. Vital signs unremarkable. No complaints at this time. Above labwork is unremarkable. I am concerned about the patient's active SI. I have consulted TTS for evaluation. The patient's disposition will be determined by the psychiatry team.   Laurence Spates, MD 01/16/16 [phone]

## 2016-01-16 NOTE — BH Assessment (Signed)
B[facility] Assessment Progress Note  Per Thedore Mins, MD, this pt requires psychiatric hospitalization at this time.  Baird Kay, RN, [facility] has assigned pt to [facility] Rm 406-2.  Pt has signed Voluntary Admission and Consent for Treatment, as well as Consent to Release Information to his brother, Theone Murdoch, and signed forms have been faxed to [facility]  Pt's nurse, Rudean Hitt, has been notified, and agrees to send original paperwork along with pt via Juel Burrow, and to call report to [phone].  Doylene Canning, MA Triage Specialist [phone]   Addendum:  Pt repeatedly questions the validity of Voluntary Admission and Consent for Treatment, as well as the need for him to be hospitalized.  He becomes hostile and exhibits circular thinking when I attempt to answer his questions.  He is fixated on leaving the hospital to attend to his daily routine.  After staffing these details with Dahlia Byes, NP, it was decided that pt presents an elopement risk if transported to [facility] by [facility].  I have therefore spoken to EDP Frederick Peers, MD, who agrees to initiate IVC.  At 13:01 Magistrate Maisie Fus confirms receipt of IVC documents.  As of this writing service of Findings and Custody Order is pending.  Pt will be transported to [facility] via law enforcement thereafter.  Pt's nurse, Dawnaly, has been updated.  Doylene Canning, MA Triage Specialist [phone]

## 2016-01-16 NOTE — BH Assessment (Signed)
Assessment Note  [name] is an 35 y.o. male. He presents to [facility] via [employer]. Patient is voluntary. Patient was found [place] bridge by [employer] stating that he was thinking about jumping off the bridge. Patient sts, "Everything in my life is going wrong". He reports increased depression over the past several weeks due to conflict with girlfriend, car problems, difficulty getting to work and missing days at work, financial, not feeling support by family, and not having a steady place to stay. Patient is unable to contract for safety. He describes his depressive symptoms as hopelessness, isolating self from others, and tearful. He does not have a history of suicide attempts/gestures. He denies history of self mutilating behaviors. Patient does not have a family history of mental illness. Patient denies HI and AVH's. He denies history of inpatient hospitalizations. Patient does not have a outpatient psychiatric provider. He denies history of abuse (sexual, physical, emotional). Patient does report a history of of THC. However, denies recent use of THC. No alcohol use reported.   Diagnosis: Depressive Disorder  Past Medical History:  Past Medical History  Diagnosis Date  . Sarcoidosis (HCC)   . GERD (gastroesophageal reflux disease)   . Mediastinal lymphadenopathy   . Hilar lymphadenopathy   . Tobacco abuse     Past Surgical History  Procedure Laterality Date  . Bronchoscopy      Family History:  Family History  Problem Relation Age of Onset  . Coronary artery disease    . Heart attack Father     62    Social History:  reports that he quit smoking about 4 years ago. He does not have any smokeless tobacco history on file. He reports that he uses illicit drugs (Marijuana). He reports that he does not drink alcohol.  Additional Social History:  Alcohol / Drug Use Pain Medications: SEE MAR Prescriptions: SEE MAR Over the Counter: SEE MAR History of alcohol / drug use?: No history of alcohol /  drug abuse  CIWA: CIWA-Ar BP: 119/91 mmHg Pulse Rate: 71 COWS:    Allergies: No Known Allergies  Home Medications:  (Not in a hospital admission)  OB/GYN Status:  No LMP for male patient.  General Assessment Data Location of Assessment: [facility] TTS Assessment: In system Is this a Tele or Face-to-Face Assessment?: Face-to-Face Is this an Initial Assessment or a Re-assessment for this encounter?: Initial Assessment Marital status: Single Maiden name:  (n/a) Is patient pregnant?: No Pregnancy Status: No Can pt return to current living arrangement?: No Admission Status: Voluntary Is patient capable of signing voluntary admission?: No Referral Source: Self/Family/Friend Insurance type:  (Self Pay )     Crisis Care Plan Legal Guardian:  (no guardian ) Name of Psychiatrist:  (no psychiatrist ) Name of Therapist:  (no therapist )  Education Status Is patient currently in school?: No Current Grade:  (n/a) Highest grade of school patient has completed:  (n/a) Name of school:  (n/a) Contact person:  (n/a)  Risk to self with the past 6 months Suicidal Ideation: Yes-Currently Present Has patient been a risk to self within the past 6 months prior to admission? : Yes Suicidal Intent: Yes-Currently Present (patient denies current intent but had intent this am) Has patient had any suicidal intent within the past 6 months prior to admission? : Yes Is patient at risk for suicide?: Yes Suicidal Plan?: Yes-Currently Present (patient was thinking of jumping off a bridge) Has patient had any suicidal plan within the past 6 months prior  to admission? : No Specify Current Suicidal Plan:  (n/a) Access to Means: Yes Specify Access to Suicidal Means:  (access to bridge) What has been your use of drugs/alcohol within the last 12 months?:  (patient denies ) Previous Attempts/Gestures: No How many times?:  (0) Other Self Harm Risks:  (n/a) Triggers for Past Attempts: Other (Comment) (no  previous attempts or gestures) Intentional Self Injurious Behavior: None Family Suicide History: Yes Recent stressful life event(s): Conflict (Comment), Other (Comment), Turmoil (Comment) (conflict with gfriend, no steady place to stay, no $, etc.) Persecutory voices/beliefs?: No Depression: Yes Depression Symptoms: Feeling angry/irritable, Feeling worthless/self pity, Loss of interest in usual pleasures, Guilt, Fatigue, Isolating, Tearfulness, Insomnia, Despondent Substance abuse history and/or treatment for substance abuse?: No Suicide prevention information given to non-admitted patients: Not applicable  Risk to Others within the past 6 months Homicidal Ideation: No Does patient have any lifetime risk of violence toward others beyond the six months prior to admission? : No Thoughts of Harm to Others: No Current Homicidal Intent: No Current Homicidal Plan: No Access to Homicidal Means: No Identified Victim:  (n/a) History of harm to others?: No Assessment of Violence: None Noted Violent Behavior Description:  (patient is calm and cooperative ) Does patient have access to weapons?: No Criminal Charges Pending?: No Does patient have a court date: No Is patient on probation?: No  Psychosis Hallucinations: None noted Delusions: None noted  Mental Status Report Appearance/Hygiene: In scrubs Eye Contact: Good Motor Activity: Freedom of movement Speech: Logical/coherent Level of Consciousness: Alert Mood: Depressed Affect: Appropriate to circumstance Anxiety Level: None Thought Processes: Coherent, Relevant Judgement: Impaired Orientation: Person, Place, Time, Situation Obsessive Compulsive Thoughts/Behaviors: None  Cognitive Functioning Concentration: Decreased Memory: Recent Intact, Remote Intact IQ: Average Insight: Fair Appetite: Poor Weight Loss:  (pt reports wt. loss but  unsure how much) Weight Gain:  (none reported) Sleep: Decreased Total Hours of Sleep:   (varies ) Vegetative Symptoms: None  ADLScreening [facility] Assessment Services) Patient's cognitive ability adequate to safely complete daily activities?: Yes Patient able to express need for assistance with ADLs?: Yes Independently performs ADLs?: Yes (appropriate for developmental age)  Prior Inpatient Therapy Prior Inpatient Therapy: No Prior Therapy Dates:  (n/a) Prior Therapy Facilty/Provider(s):  (n/a) Reason for Treatment:  (n/a)  Prior Outpatient Therapy Prior Outpatient Therapy: No Prior Therapy Dates:  (n/a) Prior Therapy Facilty/Provider(s):  (n/a) Reason for Treatment:  (n/a) Does patient have an ACCT team?: No Does patient have Intensive In-House Services?  : No Does patient have [facility] services? : No Does patient have P4CC services?: No  ADL Screening (condition at time of admission) Patient's cognitive ability adequate to safely complete daily activities?: Yes Is the patient deaf or have difficulty hearing?: No Does the patient have difficulty seeing, even when wearing glasses/contacts?: No Does the patient have difficulty concentrating, remembering, or making decisions?: No Patient able to express need for assistance with ADLs?: Yes Does the patient have difficulty dressing or bathing?: No Independently performs ADLs?: Yes (appropriate for developmental age) Does the patient have difficulty walking or climbing stairs?: No Weakness of Legs: None Weakness of Arms/Hands: None  Home Assistive Devices/Equipment Home Assistive Devices/Equipment: None    Abuse/Neglect Assessment (Assessment to be complete while patient is alone) Physical Abuse: Denies Verbal Abuse: Denies Sexual Abuse: Denies Exploitation of patient/patient's resources: Denies Self-Neglect: Denies Values / Beliefs Cultural Requests During Hospitalization: None Spiritual Requests During Hospitalization: None   Advance Directives (For Healthcare) Does patient have an advance directive?:  No  Would patient like information on creating an advanced directive?: No - patient declined information    Additional Information 1:1 In Past 12 Months?: No CIRT Risk: No Elopement Risk: No Does patient have medical clearance?: Yes     Disposition:  Disposition Initial Assessment Completed for this Encounter: Yes (Patient meets criteria per Dr. Jannifer Franklin and Julieanne Cotton, NP) Disposition of Patient: Inpatient treatment program (Patient meets criteria for 400 hall at [facility] ) Type of inpatient treatment program: Adult  On Site Evaluation by:   Reviewed with Physician:    Melynda Ripple [facility] 01/16/2016 9:45 AM

## 2016-01-16 NOTE — Progress Notes (Signed)
Admission note:  Patient is a 35 yo male brought into [facility] ED by [employer] due to SI.  Patient was found [place] a bridge by [employer] stating that he was going to jump.  Patient stated to staff upon admission that the reason he was here was just "because I had a fight with my brother.  I've been staying between my brother and ex-girlfriend's house and now I am seriously homeless."  He denies any SI/HI/AVH.  Patient did not disclose the fact that he was on a bridge threatening to jump.  Patient is now IVC'd due to his gesture.  He states, "I'm not here for depression.  I'm just stressed. My mom is in a nursing home and my dad has passed away."  He states the "police brought me in to talk to someone."  Patient is eager to talk to a psychiatrist about discharge.  IVC paperwork was explained to him.  He denies any alcohol use stating, "I'm a social drinker."  Patient denies any drug use except THC which "I smoke every day all the time.  Nothing wrong with that."  Patient is a smoker, however, refuses patch or the gum.  Patient has past med hx of sarcoidos, gerd, mediastinal lymphadenopathy and hilar lymphadenophy.  He was oriented to room and unit.

## 2016-01-16 NOTE — Progress Notes (Addendum)
CM spoke with pt who confirms uninsured [place] resident with no pcp.  CM discussed and provided written information for uninsured accepting pcps, discussed the importance of pcp vs [employer] f/u care, www.needymeds.org, www.goodrx.com, discounted pharmacies and other [employer] such as [employer] [facility] , [employer], affordable care act, financial assistance, uninsured dental services, [place] med assist, DSS and  health department  Reviewed resources for [employer] uninsured accepting pcps like [facility] Kussmaul, family medicine at [employer], [facility], [facility], local urgent care centers, [facility], [facility] family practice, general medical clinics, family services of the [place], [facility] urgent care plus others, medication resources, [facility] out patient pharmacies and housing Pt voiced understanding and appreciation of resources provided   Provided [facility] contact information Pt agreed to a referral Cm completed referral Pt to be contact by [facility] clinical liason  Please use the resources provided to you in emergency room by case manager to assist with doctor for follow up On 01/20/2016 A referral for you has been sent to [facility] if you have not received a call in 3 days you may contact them Call Scherry Ran at [phone] Tuesday-Friday [email or web address] These [place] uninsured resources provide possible primary care providers, resources for discounted medications, housing, dental resources, affordable care act information, plus other resources for [facility]

## 2016-01-16 NOTE — Tx Team (Signed)
Initial Interdisciplinary Treatment Plan   PATIENT STRESSORS: Financial difficulties Marital or family conflict Occupational concerns   PATIENT STRENGTHS: Average or above average intelligence Capable of independent living Communication skills Supportive family/friends   PROBLEM LIST: Problem List/Patient Goals Date to be addressed Date deferred Reason deferred Estimated date of resolution  Family conflict 01/16/2016     Depression 01/16/2016     Suicidal Ideation 01/16/2016     "I don't need to be here." 01/16/2016     Minimization of mental health issue 01/16/2016                              DISCHARGE CRITERIA:  Adequate post-discharge living arrangements Improved stabilization in mood, thinking, and/or behavior Need for constant or close observation no longer present Reduction of life-threatening or endangering symptoms to within safe limits Verbal commitment to aftercare and medication compliance  PRELIMINARY DISCHARGE PLAN: Outpatient therapy Return to previous living arrangement  PATIENT/FAMIILY INVOLVEMENT: This treatment plan has been presented to and reviewed with the patient, [name].  The patient and family have been given the opportunity to ask questions and make suggestions.  Cranford Mon 01/16/2016, 3:37 PM

## 2016-01-16 NOTE — ED Notes (Signed)
Sherlynn Stalls, Charge RN, made aware of sitter need. Consulting civil engineer notified AC.

## 2016-01-16 NOTE — Progress Notes (Signed)
Pt attended evening wrap-up group.

## 2016-01-17 ENCOUNTER — Encounter (HOSPITAL_COMMUNITY): Payer: Self-pay | Admitting: Psychiatry

## 2016-01-17 DIAGNOSIS — R45851 Suicidal ideations: Secondary | ICD-10-CM | POA: Diagnosis present

## 2016-01-17 DIAGNOSIS — F330 Major depressive disorder, recurrent, mild: Secondary | ICD-10-CM

## 2016-01-17 MED ORDER — HYDROXYZINE HCL 25 MG PO TABS
25.0000 mg | ORAL_TABLET | Freq: Four times a day (QID) | ORAL | Status: DC | PRN
  Administered 2016-01-17: 25 mg via ORAL
  Filled 2016-01-17: qty 1

## 2016-01-17 NOTE — BHH Group Notes (Signed)
[facility] LCSW Aftercare Discharge Planning Group Note  01/17/2016 8:45 AM  Pt did not attend, declined invitation.   Chad Cordial, LCSWA 01/17/2016 10:42 AM

## 2016-01-17 NOTE — BHH Suicide Risk Assessment (Signed)
[facility] INPATIENT:  Family/Significant Other Suicide Prevention Education  Suicide Prevention Education:  Patient Refusal for Family/Significant Other Suicide Prevention Education: The patient [name] has refused to provide written consent for family/significant other to be provided Family/Significant Other Suicide Prevention Education during admission and/or prior to discharge.  Physician notified.  Elaina Hoops 01/17/2016, 1:03 PM

## 2016-01-17 NOTE — BHH Group Notes (Signed)
[facility] LCSW Group Therapy 01/17/2016 1:15pm  Type of Therapy: Group Therapy- Feelings Around Relapse and Recovery  Participation Level: Active   Participation Quality:  Appropriate  Affect:  Appropriate  Cognitive: Alert and Oriented   Insight:  Developing   Engagement in Therapy: Developing/Improving and Engaged   Modes of Intervention: Clarification, Confrontation, Discussion, Education, Exploration, Limit-setting, Orientation, Problem-solving, Rapport Building, Dance movement psychotherapist, Socialization and Support  Summary of Progress/Problems: The topic for today was feelings about relapse. The group discussed what relapse prevention is to them and identified triggers that they are on the path to relapse. Members also processed their feeling towards relapse and were able to relate to common experiences. Group also discussed coping skills that can be used for relapse prevention.  Pt identified lack of control as something he finds difficult to deal with when facing his illness. Pt reports that his children motivate him to make better decisions.    Therapeutic Modalities:   Cognitive Behavioral Therapy Solution-Focused Therapy Assertiveness Training Relapse Prevention Therapy    [name] [phone] 01/17/2016 5:13 PM

## 2016-01-17 NOTE — Plan of Care (Signed)
Problem: Diagnosis: Increased Risk For Suicide Attempt Goal: STG-Patient Will Comply With Medication Regime Outcome: Progressing Pt compliant with medication regime     

## 2016-01-17 NOTE — Tx Team (Signed)
Interdisciplinary Treatment Plan Update (Adult) Date: 01/17/2016   Date: 01/17/2016 1:03 PM  Progress in Treatment:  Attending groups: Pt is new to milieu, continuing to assess  Participating in groups: Pt is new to milieu, continuing to assess  Taking medication as prescribed: Yes  Tolerating medication: Yes  Family/Significant othe contact made: No, Pt declines Patient understands diagnosis: No, Pt minimizes reasons for admission Discussing patient identified problems/goals with staff: Yes  Medical problems stabilized or resolved: Yes  Denies suicidal/homicidal ideation: Yes Patient has not harmed self or Others: Yes   New problem(s) identified: None identified at this time.   Discharge Plan or Barriers: Pt will go to brother's home and will follow-up with [facility]  Additional comments:  Patient and CSW reviewed pt's identified goals and treatment plan. Patient verbalized understanding and agreed to treatment plan. CSW reviewed [facility] "Discharge Process and Patient Involvement" Form. Pt verbalized understanding of information provided and signed form.    Reason for Continuation of Hospitalization:  Depression Medication stabilization Suicidal ideation  Estimated length of stay: 2-3 days  Review of initial/current patient goals per problem list:   1.  Goal(s): Patient will participate in aftercare plan  Met:  Yes  Target date: 3-5 days from date of admission   As evidenced by: Patient will participate within aftercare plan AEB aftercare provider and housing plan at discharge being identified.   01/17/16: Pt will discharge to brother's home and follow-up with [facility]  2.  Goal (s): Patient will exhibit decreased depressive symptoms and suicidal ideations.  Met:  Yes  Target date: 3-5 days from date of admission   As evidenced by: Patient will utilize self rating of depression at 3 or below and demonstrate decreased  signs of depression or be deemed stable for discharge by MD.  01/17/16: Pt denies feeling depressed and denies SI  Attendees:  Patient:    Family:    Physician: Dr. Cobos, MD  01/17/2016 1:03 PM  Nursing: Jennifer Clark, RN Case manager  01/17/2016 1:03 PM  Clinical Social Worker Lauren Carter, LCSWA 01/17/2016 1:03 PM  Other: Kristin Drinkard, LCSWA 01/17/2016 1:03 PM  Clinical: Patrice White, RN; Jennifer Pritchett 01/17/2016 1:03 PM  Other: , RN Charge Nurse 01/17/2016 1:03 PM  Other: Dolora Sutton, P4CC    Lauren Carter, LCSWA Clinical Social Work [phone]      

## 2016-01-17 NOTE — Progress Notes (Signed)
Recreation Therapy Notes  Date: 02.03.2017 Time: 9:30am Location: [address] Group Room   Group Topic: Stress Management  Goal Area(s) Addresses:  Patient will actively participate in stress management techniques presented during session.   Behavioral Response: Did not attend.   Marykay Lex Junia Nygren, LRT/CTRS        Jearl Klinefelter 01/17/2016 3:33 PM

## 2016-01-17 NOTE — H&P (Signed)
Psychiatric Admission Assessment Adult  Patient Identification: [name]  MRN:  [id number]  Date of Evaluation:  01/17/2016  Chief Complaint: Suicide threats.  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 Diagnosis:   Patient Active Problem List   Diagnosis Date Noted  . 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 Severe major depression, single episode, without psychotic features (HCC) [F32.2] 01/16/2016  . Major depressive disorder, recurrent, severe without psychotic features (HCC) [F33.2] 01/16/2016  . Abdominal wall strain [S39.011A] 05/06/2014  . Sarcoidosis (HCC) [D86.9] 05/22/2011   History of Present Illness: [name] is a 35 year old African-American male. Admitted to [facility] adult unit from the [facility] ED with reported complaints of suicidal ideation with plans to jump off of a bridge. Reports indicated that Patient was seen on top of a bridge looking down. When asked, he stated that he was trying to jump to his death. During this assessment, [name] reports, "The police took me to the [facility] ED yesterday. My brother called them. I guess he thought that I was serious that I was gonna kill myself. But, I was not gonna kill myself. I just stated that I was done with people, done trying to please everybody. I have never threatened to hurt myself, never attempted to kill myself. I got into an argument with my brother yesterday. I'm tired of living with him because I know he does not want me living with him. It is his house, I know. I'm grown. I do not want to live with him any way. But, he is also messing with my plan to go leave on my own. It is the money thing between he & me. I don't want to discuss this particular issues now. I'm not depressed or even been depressed. I'm not suicidal or thinking about killing anyone else. I love people, I love me. I'm always happy, never been treated for  depression or diagnosed with depression. I'm not anxious. I don't need treatment for depression. I'm not on drugs, just weed. I smoke weed everyday. It relaxes me, helps my sleep & appetite. I have no alcohol problems. I drink once a month, when offered by people. I don't drink enough to get drunk".  Objective: [name] has been seen, chart reviewed. He is alert, oriented x 3, aware of situation. He currently denies any symptoms of depression/anxiety. He declines any form of medication for depression. He says he was thrown out of his living situation in December, 2016 by his girlfriend. Has to move-in with younger brother. Felt brother does not want him living with him. He says he is down on his luck, no employment, no home of his own. Has a felony charge for selling drugs, spent a year in prison. Says he is trying to build himself back up, but it has been very difficult.  Associated Signs/Symptoms:  Depression Symptoms:  Denies any symptoms of depression  (Hypo) Manic Symptoms:  Impulsivity,  Anxiety Symptoms:  Excessive Worry,  Psychotic Symptoms:  Denies any psychotic symptoms  PTSD Symptoms: Denies any symptoms  Total Time spent with patient: 1 hour  Past Psychiatric History: Cannabis dependence  Risk to Self: Is patient at risk for suicide?: Yes Risk to Others: No Prior Inpatient Therapy: No Prior Outpatient Therapy: No  Alcohol Screening: 1. How often do you have a drink containing alcohol?: Never 9. Have you or someone else been injured as a result of your drinking?: No 10. Has a relative or  friend or a doctor or another health worker been concerned about your drinking or suggested you cut down?: No Alcohol Use Disorder Identification Test Final Score (AUDIT): 0 Brief Intervention: AUDIT score less than 7 or less-screening does not suggest unhealthy drinking-brief intervention not indicated  Substance Abuse History in the last 12 months:  Yes.    Consequences of Substance  Abuse: Medical Consequences:  Liver damage, Possible death by overdose Legal Consequences:  Arrests, jail time, Loss of driving privilege. Family Consequences:  Family discord, divorce and or separation.  Previous Psychotropic Medications: Yes   Psychological Evaluations: Yes   Past Medical History:  Past Medical History  Diagnosis Date  . Sarcoidosis ([place])   . GERD (gastroesophageal reflux disease)   . Mediastinal lymphadenopathy   . Hilar lymphadenopathy   . Tobacco abuse     Past Surgical History  Procedure Laterality Date  . Bronchoscopy     Family History:  Family History  Problem Relation Age of Onset  . Coronary artery disease    . Heart attack Father     3   Family Psychiatric  History: Dementia: Grandmother  Tobacco Screening: Denies smoking cigarettes, admits daily use of THC.  Social History:  History  Alcohol Use No    Comment: on weekends     History  Drug Use  . Yes  . Special: Marijuana    Additional Social History:  Allergies:  No Known Allergies  Lab Results:  Results for orders placed or performed during the hospital encounter of 01/16/16 (from the past 48 hour(s))  Comprehensive metabolic panel     Status: Abnormal   Collection Time: 01/16/16  6:52 AM  Result Value Ref Range   Sodium 139 135 - 145 mmol/L   Potassium 3.9 3.5 - 5.1 mmol/L   Chloride 103 101 - 111 mmol/L   CO2 25 22 - 32 mmol/L   Glucose, Bld 101 (H) 65 - 99 mg/dL   BUN 14 6 - 20 mg/dL   Creatinine, Ser 1.00 0.61 - 1.24 mg/dL   Calcium 9.8 8.9 - 10.3 mg/dL   Total Protein 7.9 6.5 - 8.1 g/dL   Albumin 4.7 3.5 - 5.0 g/dL   AST 20 15 - 41 U/L   ALT 16 (L) 17 - 63 U/L   Alkaline Phosphatase 49 38 - 126 U/L   Total Bilirubin 1.2 0.3 - 1.2 mg/dL   GFR calc non Af Amer >60 >60 mL/min   GFR calc Af Amer >60 >60 mL/min    Comment: (NOTE) The eGFR has been calculated using the CKD EPI equation. This calculation has not been validated in all clinical situations. eGFR's  persistently <60 mL/min signify possible Chronic Kidney Disease.    Anion gap 11 5 - 15  CBC     Status: None   Collection Time: 01/16/16  6:52 AM  Result Value Ref Range   WBC 5.6 4.0 - 10.5 K/uL   RBC 5.38 4.22 - 5.81 MIL/uL   Hemoglobin 16.4 13.0 - 17.0 g/dL   HCT 49.1 39.0 - 52.0 %   MCV 91.3 78.0 - 100.0 fL   MCH 30.5 26.0 - 34.0 pg   MCHC 33.4 30.0 - 36.0 g/dL   RDW 13.8 11.5 - 15.5 %   Platelets 198 150 - 400 K/uL  Ethanol (ETOH)     Status: None   Collection Time: 01/16/16  6:53 AM  Result Value Ref Range   Alcohol, Ethyl (B) <5 <5 mg/dL    Comment:  LOWEST DETECTABLE LIMIT FOR SERUM ALCOHOL IS 5 mg/dL FOR MEDICAL PURPOSES ONLY   Salicylate level     Status: None   Collection Time: 01/16/16  6:53 AM  Result Value Ref Range   Salicylate Lvl <1.8 2.8 - 30.0 mg/dL  Acetaminophen level     Status: Abnormal   Collection Time: 01/16/16  6:53 AM  Result Value Ref Range   Acetaminophen (Tylenol), Serum <10 (L) 10 - 30 ug/mL    Comment:        THERAPEUTIC CONCENTRATIONS VARY SIGNIFICANTLY. A RANGE OF 10-30 ug/mL MAY BE AN EFFECTIVE CONCENTRATION FOR MANY PATIENTS. HOWEVER, SOME ARE BEST TREATED AT CONCENTRATIONS OUTSIDE THIS RANGE. ACETAMINOPHEN CONCENTRATIONS >150 ug/mL AT 4 HOURS AFTER INGESTION AND >50 ug/mL AT 12 HOURS AFTER INGESTION ARE OFTEN ASSOCIATED WITH TOXIC REACTIONS.   Urine rapid drug screen (hosp performed) (Not at [facility])     Status: Abnormal   Collection Time: 01/16/16 10:01 AM  Result Value Ref Range   Opiates NONE DETECTED NONE DETECTED   Cocaine NONE DETECTED NONE DETECTED   Benzodiazepines NONE DETECTED NONE DETECTED   Amphetamines NONE DETECTED NONE DETECTED   Tetrahydrocannabinol POSITIVE (A) NONE DETECTED   Barbiturates NONE DETECTED NONE DETECTED    Comment:        DRUG SCREEN FOR MEDICAL PURPOSES ONLY.  IF CONFIRMATION IS NEEDED FOR ANY PURPOSE, NOTIFY LAB WITHIN 5 DAYS.        LOWEST DETECTABLE LIMITS FOR URINE DRUG  SCREEN Drug Class       Cutoff (ng/mL) Amphetamine      1000 Barbiturate      200 Benzodiazepine   299 Tricyclics       371 Opiates          300 Cocaine          300 THC              50    Metabolic Disorder Labs:  No results found for: HGBA1C, MPG No results found for: PROLACTIN No results found for: CHOL, TRIG, HDL, CHOLHDL, VLDL, LDLCALC  Current Medications: Current Facility-Administered Medications  Medication Dose Route Frequency Provider Last Rate Last Dose  . acetaminophen (TYLENOL) tablet 650 mg  650 mg Oral Q6H PRN Delfin Gant, NP      . alum & mag hydroxide-simeth (MAALOX/MYLANTA) 200-200-20 MG/5ML suspension 30 mL  30 mL Oral Q4H PRN Delfin Gant, NP      . magnesium hydroxide (MILK OF MAGNESIA) suspension 30 mL  30 mL Oral Daily PRN Delfin Gant, NP      . traZODone (DESYREL) tablet 50 mg  50 mg Oral QHS PRN Harriet Butte, NP   50 mg at 01/16/16 2318   PTA Medications: Prescriptions prior to admission  Medication Sig Dispense Refill Last Dose  . ibuprofen (ADVIL,MOTRIN) 800 MG tablet Take 1 tablet (800 mg total) by mouth 3 (three) times daily. (Patient not taking: Reported on 01/16/2016) 21 tablet 0 Completed Course at Unknown time   Musculoskeletal: Strength & Muscle Tone: within normal limits Gait & Station: normal Patient leans: N/A  Psychiatric Specialty Exam: Physical Exam  Constitutional: He is oriented to person, place, and time. He appears well-developed.  HENT:  Head: Normocephalic.  Eyes: Pupils are equal, round, and reactive to light.  Neck: Normal range of motion.  Cardiovascular: Normal rate.   Respiratory: Effort normal.  GI: Soft.  Genitourinary:  Denies any issues in this area  Musculoskeletal: Normal range of motion.  Neurological: He is alert and oriented to person, place, and time.  Skin: Skin is warm and dry.  Psychiatric: His speech is normal and behavior is normal. Judgment and thought content normal. His mood  appears not anxious. His affect is not angry, not blunt, not labile and not inappropriate. Cognition and memory are normal. He does not exhibit a depressed mood.    Review of Systems  Constitutional: Positive for malaise/fatigue.  HENT: Negative.   Eyes: Negative.   Respiratory: Negative.   Cardiovascular: Negative.   Gastrointestinal: Negative.   Genitourinary: Negative.   Musculoskeletal: Negative.   Skin: Negative.   Neurological: Positive for weakness.  Psychiatric/Behavioral: Positive for substance abuse (UDS + for THC). Negative for depression (Denies feeling or being depressed), suicidal ideas, hallucinations and memory loss. The patient is not nervous/anxious and does not have insomnia.     Blood pressure 115/69, pulse 52, temperature 98.1 F (36.7 C), temperature source Oral, resp. rate 20, height 6' (1.829 m), weight 86.183 kg (190 lb), SpO2 100 %.Body mass index is 25.76 kg/(m^2).  General Appearance: Casual  Eye Contact::  Good  Speech:  Clear and Coherent and Normal Rate  Volume:  Normal  Mood:  Denies feeling or being depressed or anxious  Affect:  Appropriate  Thought Process:  Coherent, Goal Directed and Logical  Orientation:  Full (Time, Place, and Person)  Thought Content:  Denies any hallucinations, delusional thought or paranoia.  Suicidal Thoughts:  Denies thoughts or Hx of attempts  Homicidal Thoughts:  No  Memory:  Immediate;   Good Recent;   Good Remote;   Good  Judgement:  Fair  Insight:  Fair  Psychomotor Activity:  Normal  Concentration:  Good  Recall:  Good  Fund of Knowledge:Fair  Language: Good  Akathisia:  No  Handed:  Right  AIMS (if indicated):     Assets:  Communication Skills Desire for Improvement Physical Health  ADL's:  Intact  Cognition: WNL  Sleep:  Number of Hours: 5.75   Treatment Plan/Recommendations: 1. Admit for crisis management and stabilization, estimated length of stay 3-5 days.  2. Medication management to reduce  current symptoms to base line and improve the patient's overall level of functioning; Declines to be on any medications  3. Treat health problems as indicated.  4. Develop treatment plan to decrease risk of relapse upon discharge and the need for readmission.  5. Psycho-social education regarding relapse prevention and self care.  6. Health care follow up as needed for medical problems.  7. Review, reconcile, and reinstate any pertinent home medications for other health issues where appropriate. 8. Call for consults with hospitalist for any additional specialty patient care services as needed.  Observation Level/Precautions:  15 minute checks  Laboratory:  Per ED, UDS + for [facility]  Psychotherapy: Group sessions    Medications: Hydroxyzine 25 mg for anxiety, Trazodone 50 mg for insomnia   Consultations: As needed   Discharge Concerns: Safety, mood stability   Estimated LOS: 3-5 days  Other:     I certify that inpatient services furnished can reasonably be expected to improve the patient's condition.    Encarnacion Slates, NP, PMHNP-BC 2/3/201711:04 AM Case discussed with NP and patient seen by me  Agree with NP note and assessment  35 year old male, currently living with his brother. States he has faced some recent stressors, to include having transportation difficulties, which made getting to and from work difficult, and being unemployed at this time. Also reports mother  is in [place] following a CVA. Patient was brought to ED by [employer] due to suicidal ideations of jumping from a bridge  Patient states that he had a verbal argument with his brother, but states " I was never really suicidal", " I guess I just wanted my brother to understand my point of view". " I am not even depressed , either". Currently , as above, minimizes symptoms of depression- reports appetite, energy level, concentration, Sleep pattern all within normal, denies recent anhedonia or suicidal ideations, denies any  psychotic symptoms. Endorses daily cannabis use . Denies any prior history of severe depression or any prior history of suicidal ideations or attempts, and states he has never been on any psychiatric medications in the past . At this time presents euthymic, with a full range of affect, denies any SI. He is future oriented, and is focused on being discharged soon because he has a couple of job interviews coming up early next week and states " what I really need is a job, and i don't want to miss the opportunity".  Dx- Suicidal Ideations / Cannabis Dependence  Plan - inpatient admission- as noted, at this time patient presents euthymic, with no SI or HI, and there are no current grounds for involuntary commitment . He is agreeing to voluntary Admission for further observation/ monitoring at this time We discussed medication alternatives, patient states " I am fine , my mood is fine, I really do not need to be on any psychiatric medication".

## 2016-01-17 NOTE — Progress Notes (Signed)
Patient ID: [name], male   DOB: Aug 04, 1981, 35 y.o.   MRN: [id number] D: Patient in dayroom playing cards with peers. Pt stated he had enough time to think about the events that led to his admission. Pt stated he wanted attention from family but have learned healthy ways to seek attention. Pt stated learning to be more patient. Pt reports tolerating medication well. Pt denies SI/HI/AVH and pain. Cooperative with assessment.  A: Met with pt 1:1. Medications administered as prescribed. Support and encouragement provided.   R: Patient remains safe and complaint with medications.

## 2016-01-17 NOTE — [facility] Suicide Risk Assessment (Addendum)
[facility] Admission Suicide Risk Assessment   Nursing information obtained from:   patient and chart  Demographic factors:   35 year old single male, currently living with a brother  Current Mental Status:   see below  Loss Factors:   current unemployment, transportation challenges, recent altercation with brother  Historical Factors:   depression/ cannabis dependence  Risk Reduction Factors:   resilience  Total Time spent with patient: 45 minutes Principal Problem: Suicidal Ideations  Diagnosis:   Patient Active Problem List   Diagnosis Date Noted  . 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 Severe major depression, single episode, without psychotic features (HCC) [F32.2] 01/16/2016  . Major depressive disorder, recurrent, severe without psychotic features (HCC) [F33.2] 01/16/2016  . Abdominal wall strain [S39.011A] 05/06/2014  . Sarcoidosis (HCC) [D86.9] 05/22/2011     Continued Clinical Symptoms:  Alcohol Use Disorder Identification Test Final Score (AUDIT): 0 The "Alcohol Use Disorders Identification Test", Guidelines for Use in Primary Care, Second Edition.  World Science writer [facility]). Score between 0-7:  no or low risk or alcohol related problems. Score between 8-15:  moderate risk of alcohol related problems. Score between 16-19:  high risk of alcohol related problems. Score 20 or above:  warrants further diagnostic evaluation for alcohol dependence and treatment.   CLINICAL FACTORS:  35 year old male, currently living with his brother. States he has faced some recent stressors, to include having transportation difficulties, which made getting to and from work difficult, and being unemployed at this time. Also reports mother is in  A Nursing Home following a CVA. Patient was brought to ED by [employer] due to suicidal ideations of jumping from a bridge  Patient states that he had a verbal argument with his brother, but states " I was never really  suicidal", " I guess I just wanted my brother to understand my point of view". " I am not even depressed , either". Currently , as above, minimizes symptoms of depression- reports appetite, energy level, concentration,  Sleep pattern all within normal, denies recent anhedonia or suicidal ideations, denies any psychotic symptoms. Endorses daily cannabis use . Denies any prior history of severe depression or any prior history of suicidal ideations or attempts, and states he has never been on any psychiatric medications in the past . At this time presents euthymic, with a full range of affect, denies any SI. He is future oriented, and is focused on being discharged soon because he has a couple of job interviews coming up early next week and states " what I really need is a job, and i don't want to miss the opportunity".  Dx- Suicidal Ideations / Cannabis Dependence  Plan - inpatient admission- as noted, at this time patient presents euthymic, with no SI or HI, and there are no current grounds for involuntary commitment . He is agreeing to voluntary  Admission for further observation/ monitoring  at this time We discussed medication alternatives, patient states " I am fine , my mood is fine, I really do not need to be on any psychiatric medication".  Musculoskeletal: Strength & Muscle Tone: within normal limits Gait & Station: normal Patient leans: N/A  Psychiatric Specialty Exam: ROS  Blood pressure 115/69, pulse 52, temperature 98.1 F (36.7 C), temperature source Oral, resp. rate 20, height 6' (1.829 m), weight 190 lb (86.183 kg), SpO2 100 %.Body mass index is 25.76 kg/(m^2).  General Appearance: Well Groomed  Eye Contact::  Good  Speech:  Normal Rate  Volume:  Normal  Mood:  Euthymic  Affect:  Appropriate and Full Range  Thought Process:  Linear  Orientation:  Full (Time, Place, and Person)  Thought Content:  denies hallucinations, no delusions, not internally preoccupied   Suicidal  Thoughts:  No denies any suicidal ideations, no self injurious ideations  Homicidal Thoughts:  No denies any violent ideations or any HI, specifically also denies any HI towards brother   Memory:  recent and remote grossly intact   Judgement:  Fair  Insight:  improving   Psychomotor Activity:  Normal  Concentration:  Good  Recall:  Good  Fund of Knowledge:Good  Language: Good  Akathisia:  Negative  Handed:  Right  AIMS (if indicated):     Assets:  Communication Skills Desire for Improvement Physical Health Resilience  Sleep:  Number of Hours: 5.75  Cognition: WNL  ADL's:  Intact    COGNITIVE FEATURES THAT CONTRIBUTE TO RISK:  Loss of executive function    SUICIDE RISK:   Mild:  Suicidal ideation of limited frequency, intensity, duration, and specificity.  There are no identifiable plans, no associated intent, mild dysphoria and related symptoms, good self-control (both objective and subjective assessment), few other risk factors, and identifiable protective factors, including available and accessible social support.  PLAN OF CARE: Patient will be admitted to inpatient psychiatric unit for stabilization and safety. Will provide and encourage milieu participation. Provide medication management and maked adjustments as needed.  Will follow daily.    I certify that inpatient services furnished can reasonably be expected to improve the patient's condition.   Nehemiah Massed, MD 01/17/2016, 2:17 PM

## 2016-01-17 NOTE — Progress Notes (Signed)
D: Pt presents with rapid pressured speech and flight of ideas. Pt states that he doesn't understand why he is here. Pt states that he made the comment that he was going to jump off a bridge because he was seeking attention from his brother. Pt denies suicidal thoughts this morning. Pt wanting to discharge today because he have a job interview today. Pt states that he have a lot of problems that he's dealing with, but as a "black man, a man" he's not asking for anyone to pity him but at times he do need support. Pt states that he have no family support, brother is not supportive, father passed away and mother is in a nursing home.  A: Orders reviewed by Clinical research associate. Verbal support provided. Pt encouraged to attend groups. 15 minute checks performed for safety.  R: Pt safety maintained.

## 2016-01-17 NOTE — BHH Counselor (Signed)
Adult Comprehensive Assessment  Patient ID: [name], male   DOB: [birth year], 35 y.o.   MRN: [id number]  Information Source: Information source: Patient  Current Stressors:  Educational / Learning stressors: None reported Employment / Job issues: Pt wants to get back to his job and other interviews that are upcoming Family Relationships: Limited family support; frustrated with brother for having him committed Surveyor, quantity / Lack of resources (include bankruptcy): Limited income currently Housing / Lack of housing: Living with brother which he reports is stressful Physical health (include injuries & life threatening diseases): None reported Social relationships: Limited social support Substance abuse: daily THC use Bereavement / Loss: Father deceased  Living/Environment/Situation:  Living Arrangements: Other relatives (lives with brother) Living conditions (as described by patient or guardian): lives with brother but feels unwanted How long has patient lived in current situation?: "not even that long" What is atmosphere in current home: Chaotic  Family History:  Marital status: Single Does patient have children?: Yes How many children?: 2 How is patient's relationship with their children?: 13yo and 16yo children; has a great relationship with thwm  Childhood History:  By whom was/is the patient raised?: Mother, Grandparents Description of patient's relationship with caregiver when they were a child: wasn't as close to mother growing up, she was strict but he respected her; good with grandmother Patient's description of current relationship with people who raised him/her: mother is in a nursing home; father is deceased; grandmother has dementia How were you disciplined when you got in trouble as a child/adolescent?: Unknown Does patient have siblings?: Yes Number of Siblings: 3 Description of patient's current relationship with siblings: overall good relationships with  brothers Did patient suffer any verbal/emotional/physical/sexual abuse as a child?: No Did patient suffer from severe childhood neglect?: No Has patient ever been sexually abused/assaulted/raped as an adolescent or adult?: No Was the patient ever a victim of a crime or a disaster?: No Witnessed domestic violence?: No Has patient been effected by domestic violence as an adult?: No  Education:  Highest grade of school patient has completed: 12th grade Currently a student?: No Learning disability?: No  Employment/Work Situation:   Employment situation: Unemployed (actively looking for a job) Patient's job has been impacted by current illness: No What is the longest time patient has a held a job?: 7-8 years Where was the patient employed at that time?: [employer] Has patient ever been in the [employer]?: No Has patient ever served in combat?: No Did You Receive Any Psychiatric Treatment/Services While in Equities trader?: No Are There Guns or Other Weapons in Your Home?: No  Financial Resources:   Financial resources: Income from employment, Private insurance Does patient have a representative payee or guardian?: No  Alcohol/Substance Abuse:   What has been your use of drugs/alcohol within the last 12 months?: smokes THC: 2-3 blunts daily for 14 years If attempted suicide, did drugs/alcohol play a role in this?: No Alcohol/Substance Abuse Treatment Hx: Denies past history Has alcohol/substance abuse ever caused legal problems?: No  Social Support System:   Conservation officer, nature Support System: Poor Describe Community Support System: "it's all me" Type of faith/religion: Spiritual How does patient's faith help to cope with current illness?: "I can sense if people are good or bad"  Leisure/Recreation:   Leisure and Hobbies: being around good people, interacting with others  Strengths/Needs:   What things does the patient do well?: interacting with people In what areas does patient  struggle / problems for patient: Pt did not state  Discharge Plan:   Does patient have access to transportation?: No Plan for no access to transportation at discharge: car is broken down Will patient be returning to same living situation after discharge?: Yes Currently receiving community mental health services: No If no, would patient like referral for services when discharged?: Yes (What county?) Does patient have financial barriers related to discharge medications?: Yes  Summary/Recommendations:     Patient is a 35 year old male admitted with a diagnosis of Major Depressive Disorder. Pt presented to the hospital by [employer] due to threats of suicide. Pt reports primary trigger(s) for admission was conflict with brother. Patient will benefit from crisis stabilization, medication evaluation, group therapy and psycho education in addition to case management for discharge planning. At discharge it is recommended that Pt remain compliant with established discharge plan and continued treatment.    Elaina Hoops. 01/17/2016

## 2016-01-18 DIAGNOSIS — F332 Major depressive disorder, recurrent severe without psychotic features: Principal | ICD-10-CM

## 2016-01-18 DIAGNOSIS — F1220 Cannabis dependence, uncomplicated: Secondary | ICD-10-CM

## 2016-01-18 DIAGNOSIS — R45851 Suicidal ideations: Secondary | ICD-10-CM

## 2016-01-18 MED ORDER — TRAZODONE HCL 50 MG PO TABS: 50.0000 mg | ORAL_TABLET | Freq: Every evening | ORAL | Status: DC | PRN

## 2016-01-18 NOTE — Progress Notes (Signed)
  [facility] Adult Case Management Discharge Plan :  Will you be returning to the same living situation after discharge:  Yes,  returning home At discharge, do you have transportation home?: Yes,  arranged a ride Do you have the ability to pay for your medications: No.  Release of information consent forms completed and in the chart;  Patient's signature needed at discharge.  Patient to Follow up at: Follow-up Information    Follow up with [facility] On 01/23/2016.   Why:  at 10:00am for therapy with Jasmine December. If this appointment does not work with your schedule, please call to reschedule.   Contact information:   [facility] [address]  [address]  [place], [place] [address] PHONE: [phone] FAX: [phone]      Next level of care provider has access to [facility] Link:no  Safety Planning and Suicide Prevention discussed: No. Pt refused consent  Have you used any form of tobacco in the last 30 days? (Cigarettes, Smokeless Tobacco, Cigars, and/or Pipes): Yes  Has patient been referred to the Quitline?: N/A patient is not a smoker When asked about referral, he stated he does not smoke or need a referral  Patient has been referred for addiction treatment: Pt. refused referral  Sarina Ser 01/18/2016, 12:30 PM

## 2016-01-18 NOTE — Progress Notes (Signed)
Orders received for patient discharge. Patient states he is ready to go , feels good about '' leaving, I've gotten a lot of good help here and ya'll are some really good people '' he denies any SI/HI/A/V Hallucinations. No signs of acute decompensation. Discharge instructions reviewed with the patient at length. Crisis services reviewed as well and opportunity for questions provided. Pt denies any acute concerns. Rx given as well as AVS printout for the patient. All pt belongings were returned and he was escorted from the unit to the lobby .

## 2016-01-18 NOTE — BHH Group Notes (Signed)
BHH LCSW Group Therapy Note  01/18/2016 10 AM  Type of Therapy and Topic:  Group Therapy: Avoiding Self-Sabotaging and Enabling Behaviors  Participation Level:  Active   Description of Group:     Learn how to identify obstacles, self-sabotaging and enabling behaviors, what are they, why do we do them and what needs do these behaviors meet? Discuss unhealthy relationships and how to have positive healthy boundaries with those that sabotage and enable. Explore aspects of self-sabotage and enabling in yourself and how to limit these self-destructive behaviors in everyday life. A scaling question is used to help patient look at where they are now in their motivation to change.    Therapeutic Goals: 1. Patient will identify one obstacle that relates to self-sabotage and enabling behaviors 2. Patient will identify one personal self-sabotaging or enabling behavior they did prior to admission 3. Patient able to establish a plan to change the above identified behavior they did prior to admission:  4. Patient will demonstrate ability to communicate their needs through discussion and/or role plays.   Summary of Patient Progress: The main focus of today's process group was to explain to what "self-sabotage" means and use Motivational Interviewing to discuss what benefits, negative or positive, were involved in a self-identified self-sabotaging behavior. We then talked about reasons the patient may want to change the behavior and their current desire to change. The Stages of Change were explained using a handout, and patients identified where they currently are with regard to stages of change. Patient processed his tendency to be patient with others and impatient with self. Pt reports he is in preparation stage yet knows he needs to into move to action.    Therapeutic Modalities:   Cognitive Behavioral Therapy Person-Centered Therapy Motivational Interviewing   Carney Bern, LCSW

## 2016-01-18 NOTE — Progress Notes (Signed)
Pt presents with pleasant mood, affect congruent. [name] states he is doing well, feeling '' great '' and anticipates his discharge home today. He denies any SI/HI/A/V Hallucinations. He completed self inventory and rates his depression at 0/10 He states he feels ready for discharge and needs to work on setting up support system for his discharge . A. Pt visible in milieu. Support given. R. Pt is safe, will con't to monitor q 15 minutes for safety.

## 2016-01-18 NOTE — BHH Suicide Risk Assessment (Signed)
[facility] Discharge Suicide Risk Assessment   Principal Problem: Suicidal ideations Discharge Diagnoses:  Patient Active Problem List   Diagnosis Date Noted  . Suicidal ideations [R45.851]   . 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 Severe major depression, single episode, without psychotic features (HCC) [F32.2] 01/16/2016  . Major depressive disorder, recurrent, severe without psychotic features (HCC) [F33.2] 01/16/2016  . Abdominal wall strain [S39.011A] 05/06/2014  . Sarcoidosis (HCC) [D86.9] 05/22/2011    Total Time spent with patient: 30 minutes  Musculoskeletal: Strength & Muscle Tone: within normal limits Gait & Station: normal Patient leans: N/A  Psychiatric Specialty Exam: ROS  Blood pressure 108/64, pulse 64, temperature 97.9 F (36.6 C), temperature source Oral, resp. rate 16, height 6' (1.829 m), weight 86.183 kg (190 lb), SpO2 100 %.Body mass index is 25.76 kg/(m^2).  General Appearance: Casual  Eye Contact::  Good  Speech:  Clear and Coherent409  Volume:  Normal  Mood:  Euthymic  Affect:  Appropriate and Congruent  Thought Process:  Coherent  Orientation:  Full (Time, Place, and Person)  Thought Content:  WDL  Suicidal Thoughts:  No  Homicidal Thoughts:  No  Memory:  Immediate;   Good Recent;   Good Remote;   Good  Judgement:  Good  Insight:  Good  Psychomotor Activity:  Normal  Concentration:  Good  Recall:  Good  Fund of Knowledge:Good  Language: Good  Akathisia:  No  Handed:  Right  AIMS (if indicated):     Assets:  Communication Skills Desire for Improvement Financial Resources/Insurance Housing Intimacy Physical Health Resilience Social Support Talents/Skills Transportation  Sleep:  Number of Hours: 6  Cognition: WNL  ADL's:  Intact   Mental Status Per Nursing Assessment::   On Admission:     Demographic Factors:  NA  Loss Factors: NA  Historical Factors: NA  Risk Reduction Factors:    Sense of responsibility to family, Religious beliefs about death, Employed, Living with another person, especially a relative, Positive social support, Positive therapeutic relationship and Positive coping skills or problem solving skills  Continued Clinical Symptoms:  Dysthymia  Cognitive Features That Contribute To Risk:  None    Suicide Risk:  Minimal: No identifiable suicidal ideation.  Patients presenting with no risk factors but with morbid ruminations; may be classified as minimal risk based on the severity of the depressive symptoms  Follow-up Information    Follow up with [facility] On 01/23/2016.   Why:  at 10:00am for therapy with Jasmine December. If this appointment does not work with your schedule, please call to reschedule.   Contact information:   [facility] [address]  [address]  [place], [place] [address] PHONE: [phone] FAX: [phone]      Plan Of Care/Follow-up recommendations:  Activity:  As tolerated Diet:  Unchanged from the past  [name] T., MD 01/18/2016, 11:13 AM

## 2016-01-18 NOTE — BHH Group Notes (Signed)
BHH Group Notes:  (Nursing/MHT/Case Management/Adjunct)  Date:  01/18/2016  Time:  10:34 AM  Type of Therapy:  Psychoeducational Skills Coping Skills  Participation Level:  Active  Participation Quality:  Appropriate  Affect:  Appropriate  Cognitive:  Alert and Appropriate  Insight:  Appropriate  Engagement in Group:  Engaged  Modes of Intervention:  Discussion, Education and Exploration  Summary of Progress/Problems: Patient stating "I feel good!" States his goal is" I don't know, I have to think a little on that, see I don't really have a support person out there. I trust the people in here more than anybody out there'. "I think I need to find a way to deal with my anxiety". States his positive coping skill is "listening to music, I love music".  [name] 01/18/2016, 10:34 AM

## 2016-01-18 NOTE — Discharge Summary (Signed)
Physician Discharge Summary Note  Patient:  [name] is an 35 y.o., male MRN:  [id number] DOB:  [birth year] Patient phone:  [phone] (home)  Patient address:   [address] [place] [address],  Total Time spent with patient: 30 minutes  Date of Admission:  01/16/2016 Date of Discharge: 01/18/2016  Reason for Admission:PER HPI- [name] is a 35 year old African-American male. Admitted to [facility] adult unit from the [facility] ED with reported complaints of suicidal ideation with plans to jump off of a bridge. Reports indicated that Patient was seen on top of a bridge looking down. When asked, he stated that he was trying to jump to his death. During this assessment, [name] reports, "The police took me to the [facility] ED yesterday. My brother called them. I guess he thought that I was serious that I was gonna kill myself. But, I was not gonna kill myself. I just stated that I was done with people, done trying to please everybody. I have never threatened to hurt myself, never attempted to kill myself. I got into an argument with my brother yesterday. I'm tired of living with him because I know he does not want me living with him. It is his house, I know. I'm grown. I do not want to live with him any way. But, he is also messing with my plan to go leave on my own. It is the money thing between he & me. I don't want to discuss this particular issues now. I'm not depressed or even been depressed. I'm not suicidal or thinking about killing anyone else. I love people, I love me. I'm always happy, never been treated for depression or diagnosed with depression. I'm not anxious. I don't need treatment for depression. I'm not on drugs, just weed. I smoke weed everyday. It relaxes me, helps my sleep & appetite. I have no alcohol problems. I drink once a month, when offered by people. I don't drink enough to get drunk".   Principal Problem: Suicidal ideations Discharge  Diagnoses: Patient Active Problem List   Diagnosis Date Noted  . Suicidal ideations [R45.851]   . 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 Severe major depression, single episode, without psychotic features (HCC) [F32.2] 01/16/2016  . Major depressive disorder, recurrent, severe without psychotic features (HCC) [F33.2] 01/16/2016  . Abdominal wall strain [S39.011A] 05/06/2014  . Sarcoidosis (HCC) [D86.9] 05/22/2011    Past Psychiatric History: SEE ABOVE  Past Medical History:  Past Medical History  Diagnosis Date  . Sarcoidosis (HCC)   . GERD (gastroesophageal reflux disease)   . Mediastinal lymphadenopathy   . Hilar lymphadenopathy   . Tobacco abuse     Past Surgical History  Procedure Laterality Date  . Bronchoscopy     Family History:  Family History  Problem Relation Age of Onset  . Coronary artery disease    . Heart attack Father     26   Family Psychiatric  History: SEE ABOVE Social History:  History  Alcohol Use No    Comment: on weekends     History  Drug Use  . Yes  . Special: Marijuana    Social History   Social History  . Marital Status: Single    Spouse Name: N/A  . Number of Children: 2  . Years of Education: N/A   Occupational History  . cook    Social History Main Topics  . Smoking status: Former Smoker  Quit date: 05/18/2011  . Smokeless tobacco: None     Comment: 1-2 cigars a day x 10 years  . Alcohol Use: No     Comment: on weekends  . Drug Use: Yes    Special: Marijuana  . Sexual Activity: Not Asked   Other Topics Concern  . None   Social History Narrative    Hospital Course:  [name] was admitted for Suicidal ideations and crisis management.  Pt was treated discharged with the medications listed below under Medication List.  Medical problems were identified and treated as needed.  Home medications were restarted as appropriate.  Improvement was monitored by observation and  [name] 's daily report of symptom reduction.  Emotional and mental status was monitored by daily self-inventory reports completed by [name] and clinical staff.         [name] was evaluated by the treatment team for stability and plans for continued recovery upon discharge. [name] 's motivation was an integral factor for scheduling further treatment. Employment, transportation, bed availability, health status, family support, and any pending legal issues were also considered during hospital stay. Pt was offered further treatment options upon discharge including but not limited to Residential, Intensive Outpatient, and Outpatient treatment.  [name] will follow up with the services as listed below under Follow Up Information.     Upon completion of this admission the patient was both mentally and medically stable for discharge denying suicidal/homicidal ideation, auditory/visual/tactile hallucinations, delusional thoughts and paranoia.    [name] responded well to treatment with individual and group session without adverse effects. Pt demonstrated improvement without reported or observed adverse effects to the point of stability appropriate for outpatient management. Pertinent labs include: CMP- Glucose 101 elevated  for which outpatient follow-up is necessary for lab recheck as mentioned below. Reviewed CBC, CMP, BAL, and UDS + TCH; all unremarkable aside from noted exceptions.   Physical Findings: AIMS: Facial and Oral Movements Muscles of Facial Expression: None, normal Lips and Perioral Area: None, normal Jaw: None, normal Tongue: None, normal,Extremity Movements Upper (arms, wrists, hands, fingers): None, normal Lower (legs, knees, ankles, toes): None, normal, Trunk Movements Neck, shoulders, hips: None, normal, Overall Severity Severity of abnormal movements (highest score from questions above): None, normal Incapacitation due to abnormal  movements: None, normal Patient's awareness of abnormal movements (rate only patient's report): No Awareness, Dental Status Current problems with teeth and/or dentures?: No Does patient usually wear dentures?: No  CIWA:    COWS:     Musculoskeletal: Strength & Muscle Tone: within normal limits Gait & Station: normal Patient leans: N/A  Psychiatric Specialty Exam: SEE SRA BY MD Review of Systems  Psychiatric/Behavioral: Negative for suicidal ideas and hallucinations. Depression: stable. Substance abuse: stable. The patient is not nervous/anxious and does not have insomnia.   All other systems reviewed and are negative.   Blood pressure 108/64, pulse 64, temperature 97.9 F (36.6 C), temperature source Oral, resp. rate 16, height 6' (1.829 m), weight 86.183 kg (190 lb), SpO2 100 %.Body mass index is 25.76 kg/(m^2).  Have you used any form of tobacco in the last 30 days? (Cigarettes, Smokeless Tobacco, Cigars, and/or Pipes): Yes  Has this patient used any form of tobacco in the last 30 days? (Cigarettes, Smokeless Tobacco, Cigars, and/or Pipes) , No  Metabolic Disorder Labs:  No results found for: HGBA1C, MPG No results found for: PROLACTIN No results found for: CHOL, TRIG, HDL, CHOLHDL, VLDL, LDLCALC  See Psychiatric Specialty Exam  and Suicide Risk Assessment completed by Attending Physician prior to discharge.  Discharge destination:  Home  Is patient on multiple antipsychotic therapies at discharge:  No   Has Patient had three or more failed trials of antipsychotic monotherapy by history:  No  Recommended Plan for Multiple Antipsychotic Therapies: NA  Discharge Instructions    Activity as tolerated - No restrictions    Complete by:  As directed      Diet general    Complete by:  As directed      Discharge instructions    Complete by:  As directed   Take all medications as prescribed. Keep all follow-up appointments as scheduled.  Do not consume alcohol or use illegal  drugs while on prescription medications. Report any adverse effects from your medications to your primary care provider promptly.  In the event of recurrent symptoms or worsening symptoms, call 911, a crisis hotline, or go to the nearest emergency department for evaluation.            Medication List    STOP taking these medications        ibuprofen 800 MG tablet  Commonly known as:  ADVIL,MOTRIN      TAKE these medications      Indication   traZODone 50 MG tablet  Commonly known as:  DESYREL  Take 1 tablet (50 mg total) by mouth at bedtime as needed for sleep (May repeat x1).   Indication:  Trouble Sleeping           Follow-up Information    Follow up with [facility] On 01/23/2016.   Why:  at 10:00am for therapy with Jasmine December. If this appointment does not work with your schedule, please call to reschedule.   Contact information:   [facility] [address]  [address]  [place], [place] [address] PHONE: [phone] FAX: [phone]      Follow-up recommendations:  Activity:  as tolerated Diet:  heart healthy  Comments: Take all medications as prescribed. Keep all follow-up appointments as scheduled.  Do not consume alcohol or use illegal drugs while on prescription medications. Report any adverse effects from your medications to your primary care provider promptly.  In the event of recurrent symptoms or worsening symptoms, call 911, a crisis hotline, or go to the nearest emergency department for evaluation.   Signed: Oneta Rack, NP 01/18/2016, 10:24 AM   Patient seen face to face for psychiatric evaluation. Chart reviewed and finding discussed with Physician extender. Agreed with disposition and treatment plan.   Kathryne Sharper, MD

## 2017-05-27 ENCOUNTER — Emergency Department (HOSPITAL_COMMUNITY)
Admission: EM | Admit: 2017-05-27 | Discharge: 2017-05-27 | Disposition: A | Discharge status: Home / Self Care | Payer: PRIVATE HEALTH INSURANCE | Attending: Emergency Medicine | Admitting: Emergency Medicine

## 2017-05-27 DIAGNOSIS — Z87891 Personal history of nicotine dependence: Secondary | ICD-10-CM | POA: Insufficient documentation

## 2017-05-27 DIAGNOSIS — Z202 Contact with and (suspected) exposure to infections with a predominantly sexual mode of transmission: Secondary | ICD-10-CM

## 2017-05-27 DIAGNOSIS — R369 Urethral discharge, unspecified: Secondary | ICD-10-CM | POA: Insufficient documentation

## 2017-05-27 MED ORDER — LIDOCAINE HCL 1 % IJ SOLN
INTRAMUSCULAR | Status: AC
  Administered 2017-05-27: 1 mL
  Filled 2017-05-27: qty 20

## 2017-05-27 MED ORDER — AZITHROMYCIN 250 MG PO TABS
1000.0000 mg | ORAL_TABLET | Freq: Once | ORAL | Status: AC
  Administered 2017-05-27: 1000 mg via ORAL
  Filled 2017-05-27: qty 4

## 2017-05-27 MED ORDER — AZITHROMYCIN 250 MG PO TABS
250.0000 mg | ORAL_TABLET | Freq: Once | ORAL | Status: AC
  Administered 2017-05-27: 250 mg via ORAL

## 2017-05-27 MED ORDER — CEFTRIAXONE SODIUM 250 MG IJ SOLR
250.0000 mg | Freq: Once | INTRAMUSCULAR | Status: AC
  Administered 2017-05-27: 250 mg via INTRAMUSCULAR
  Filled 2017-05-27: qty 250

## 2017-05-27 NOTE — ED Provider Notes (Signed)
[facility]-EMERGENCY DEPT Provider Note   CSN: [id number] Arrival date & time: 05/27/17  1046  By signing my name below, I, [name], attest that this documentation has been prepared under the direction and in the presence of Audry Piliyler Alfretta Pinch, PA-C.  Electronically Signed: [name], ED Scribe. 05/27/17. 11:14 AM.  History   Chief Complaint Chief Complaint  Patient presents with  . Penile Discharge   The history is provided by the patient. No language interpreter was used.    HPI Comments: [name] is a 36 y.o. male who presents to the Emergency Department complaining of intermittent purulent penile discharge beginning yesterday. He notes associated dysuria. He has recently only been sexually active with one partner. No noted treatments for his symptoms were tried prior to coming into the ED. He denies scrotal swelling, redness, fever, or any other associated symptoms at this time.   Past Medical History:  Diagnosis Date  . GERD (gastroesophageal reflux disease)   . Hilar lymphadenopathy   . Mediastinal lymphadenopathy   . Sarcoidosis (HCC)   . Tobacco abuse    Patient Active Problem List   Diagnosis Date Noted  . Suicidal ideations   . Cannabis dependence with physiological dependence (HCC) 01/16/2016  . Severe major depression, single episode, without psychotic features (HCC) 01/16/2016  . Major depressive disorder, recurrent, severe without psychotic features (HCC) 01/16/2016  . Abdominal wall strain 05/06/2014  . Sarcoidosis 05/22/2011   Past Surgical History:  Procedure Laterality Date  . BRONCHOSCOPY      Home Medications    Prior to Admission medications   Medication Sig Start Date End Date Taking? Authorizing Provider  traZODone (DESYREL) 50 MG tablet Take 1 tablet (50 mg total) by mouth at bedtime as needed for sleep (May repeat x1). 01/18/16   Oneta RackLewis, Tanika N, NP   Family History Family History  Problem Relation Age of Onset  . Coronary  artery disease Unknown   . Heart attack Father        7549   Social History Social History  Substance Use Topics  . Smoking status: Former Smoker    Quit date: 05/18/2011  . Smokeless tobacco: Not on file     Comment: 1-2 cigars a day x 10 years  . Alcohol use No     Comment: on weekends   Allergies   Patient has no known allergies.  Review of Systems Review of Systems  Constitutional: Negative for fever.  Genitourinary: Positive for discharge and dysuria. Negative for scrotal swelling.  Skin: Negative for color change.   Physical Exam Updated Vital Signs BP 123/72 (BP Location: Right Arm)   Pulse 63   Temp 98.6 F (37 C) (Oral)   Resp 18   Ht 6' (1.829 m)   Wt 188 lb (85.3 kg)   SpO2 100%   BMI 25.50 kg/m   Physical Exam  Constitutional: He appears well-developed and well-nourished. No distress.  HENT:  Head: Normocephalic and atraumatic.  Eyes: Conjunctivae are normal.  Neck: Normal range of motion.  Cardiovascular: Normal rate.   Pulmonary/Chest: Effort normal.  Abdominal: He exhibits no distension.  Genitourinary: Testes normal. Right testis shows no tenderness. Left testis shows no tenderness. Discharge found.  Genitourinary Comments: Chaperone present throughout entire exam. Penile discharge noted with green discoloration.   Musculoskeletal: Normal range of motion.  Neurological: He is alert.  Skin: No pallor.  Psychiatric: He has a normal mood and affect. His behavior is normal.  Nursing note and vitals reviewed.  ED Treatments / Results  DIAGNOSTIC STUDIES: Oxygen Saturation is 100% on RA, normal by my interpretation.   COORDINATION OF CARE: 11:07 AM-Discussed next steps with pt. Pt verbalized understanding and is agreeable with the plan.   Labs (all labs ordered are listed, but only abnormal results are displayed) Labs Reviewed  GC/CHLAMYDIA PROBE AMP ([facility]) NOT AT [facility]   EKG  EKG Interpretation None      Radiology No results  found.  Procedures Procedures   Medications Ordered in ED Medications  azithromycin (ZITHROMAX) tablet 1,000 mg (not administered)  cefTRIAXone (ROCEPHIN) injection 250 mg (not administered)   Initial Impression / Assessment and Plan / ED Course  I have reviewed the triage vital signs and the nursing notes.  Pertinent labs & imaging results that were available during my care of the patient were reviewed by me and considered in my medical decision making (see chart for details).  I have reviewed and evaluated the relevant laboratory values. I obtained HPI from historian.  ED Course: GC/Chlamydia, Zithromax/Rocehpin injection   Assessment: Patient is afebrile without abdominal tenderness, abdominal pain or painful bowel movements to indicate prostatitis.  No tenderness to palpation of the testes or epididymis to suggest orchitis or epididymitis.  STD cultures obtained including gonorrhea and chlamydia. Discussed importance of using protection when sexually active. Pt understands that they have GC/Chlamydia cultures pending and that they will need to inform all sexual partners if results return positive. Patient has been treated prophylactically with azithromycin and Rocephin.   Disposition/Plan:  DC Home Additional Verbal discharge instructions given and discussed with patient.  Pt Instructed to f/u with PCP in the next week for evaluation and treatment of symptoms. Return precautions given Pt acknowledges and agrees with plan  Supervising Physician Long, Arlyss Repress, MD  Final Clinical Impressions(s) / ED Diagnoses   Final diagnoses:  Penile discharge  Exposure to STD   New Prescriptions New Prescriptions   No medications on file    I personally performed the services described in this documentation, which was scribed in my presence. The recorded information has been reviewed and is accurate.    Audry Pili, PA-C 05/27/17 1116    Long, Arlyss Repress, MD 05/27/17 1944

## 2017-05-27 NOTE — Discharge Instructions (Signed)
Please read and follow all provided instructions.  Your diagnoses today include:  1. Penile discharge   2. Exposure to STD     Tests performed today include: Test for gonorrhea and chlamydia. You will be notified by telephone if you have a positive result. Vital signs. See below for your results today.   Medications:  You were treated for chlamydia (1 gram azithromycin pills) and gonorrhea (250mg  rocephin shot).  Home care instructions:  Read educational materials contained in this packet and follow any instructions provided.   You should tell your partners about your infection and avoid having sex for one week to allow time for the medicine to work.  Follow-up instructions: You should [facility] to be tested for HIV, syphilis, and hepatitis -- all of which can be transmitted by sexual contact. We do not routinely screen for these in the Emergency Department.  STD Testing: [facility] [place] [address], [place], phone [phone] or [phone]   Monday - Friday, call for an appointment [facility] [place] [place] [facility] [address], [place], phone [phone] or [phone]  Monday - Friday, call for an appointment  Return instructions:  Please return to the Emergency Department if you experience worsening symptoms.  Please return if you have any other emergent concerns.  Additional Information:  Your vital signs today were: BP 123/72 (BP Location: Right Arm)    Pulse 63    Temp 98.6 F (37 C) (Oral)    Resp 18    Ht 6' (1.829 m)    Wt 85.3 kg (188 lb)    SpO2 100%    BMI 25.50 kg/m  If your blood pressure (BP) was elevated above 135/85 this visit, please have this repeated by your doctor within one month. --------------

## 2017-05-27 NOTE — ED Triage Notes (Signed)
Pt c/o penile discharge and burning with urination since yesterday.

## 2017-05-28 LAB — GC/CHLAMYDIA PROBE AMP (~~LOC~~) NOT AT ARMC
CHLAMYDIA, DNA PROBE: NEGATIVE
Neisseria Gonorrhea: POSITIVE — AB

## 2018-03-10 ENCOUNTER — Emergency Department (HOSPITAL_COMMUNITY)
Admission: EM | Admit: 2018-03-10 | Discharge: 2018-03-11 | Disposition: A | Discharge status: Home / Self Care | Payer: PRIVATE HEALTH INSURANCE | Attending: Emergency Medicine | Admitting: Emergency Medicine

## 2018-03-10 DIAGNOSIS — R1110 Vomiting, unspecified: Secondary | ICD-10-CM | POA: Insufficient documentation

## 2018-03-10 DIAGNOSIS — R197 Diarrhea, unspecified: Secondary | ICD-10-CM | POA: Insufficient documentation

## 2018-03-10 DIAGNOSIS — R109 Unspecified abdominal pain: Secondary | ICD-10-CM | POA: Insufficient documentation

## 2018-03-10 DIAGNOSIS — Z5321 Procedure and treatment not carried out due to patient leaving prior to being seen by health care provider: Secondary | ICD-10-CM | POA: Insufficient documentation

## 2018-03-11 ENCOUNTER — Encounter (HOSPITAL_COMMUNITY): Payer: Self-pay

## 2018-03-11 LAB — COMPREHENSIVE METABOLIC PANEL
ALT: 22 U/L (ref 17–63)
ANION GAP: 6 (ref 5–15)
AST: 26 U/L (ref 15–41)
Albumin: 3.3 g/dL — ABNORMAL LOW (ref 3.5–5.0)
Alkaline Phosphatase: 41 U/L (ref 38–126)
BUN: 18 mg/dL (ref 6–20)
CALCIUM: 8 mg/dL — AB (ref 8.9–10.3)
CO2: 27 mmol/L (ref 22–32)
Chloride: 106 mmol/L (ref 101–111)
Creatinine, Ser: 1.15 mg/dL (ref 0.61–1.24)
GFR calc non Af Amer: >60 mL/min (ref >60)
GLUCOSE: 103 mg/dL — AB (ref 65–99)
POTASSIUM: 4.1 mmol/L (ref 3.5–5.1)
SODIUM: 139 mmol/L (ref 135–145)
Total Bilirubin: 0.5 mg/dL (ref 0.3–1.2)
Total Protein: 5.6 g/dL — ABNORMAL LOW (ref 6.5–8.1)

## 2018-03-11 LAB — CBC
HEMATOCRIT: 45.2 % (ref 39.0–52.0)
HEMOGLOBIN: 15.2 g/dL (ref 13.0–17.0)
MCH: 31.4 pg (ref 26.0–34.0)
MCHC: 33.6 g/dL (ref 30.0–36.0)
MCV: 93.4 fL (ref 78.0–100.0)
Platelets: 187 10*3/uL (ref 150–400)
RBC: 4.84 MIL/uL (ref 4.22–5.81)
RDW: 13.2 % (ref 11.5–15.5)
WBC: 5.8 10*3/uL (ref 4.0–10.5)

## 2018-03-11 LAB — LIPASE, BLOOD: LIPASE: 24 U/L (ref 11–51)

## 2018-03-11 NOTE — ED Triage Notes (Signed)
Pt complains of abdominal pain with vomiting and diarrhea for two days

## 2018-12-21 ENCOUNTER — Emergency Department (HOSPITAL_COMMUNITY): Payer: Self-pay

## 2018-12-21 ENCOUNTER — Other Ambulatory Visit: Payer: Self-pay

## 2018-12-21 ENCOUNTER — Encounter (HOSPITAL_COMMUNITY): Payer: Self-pay | Admitting: Emergency Medicine

## 2018-12-21 ENCOUNTER — Emergency Department (HOSPITAL_COMMUNITY)
Admission: EM | Admit: 2018-12-21 | Discharge: 2018-12-21 | Disposition: A | Discharge status: Home / Self Care | Payer: Self-pay | Attending: Emergency Medicine | Admitting: Emergency Medicine

## 2018-12-21 DIAGNOSIS — J029 Acute pharyngitis, unspecified: Secondary | ICD-10-CM

## 2018-12-21 DIAGNOSIS — R079 Chest pain, unspecified: Secondary | ICD-10-CM | POA: Insufficient documentation

## 2018-12-21 DIAGNOSIS — F17210 Nicotine dependence, cigarettes, uncomplicated: Secondary | ICD-10-CM | POA: Insufficient documentation

## 2018-12-21 DIAGNOSIS — R0602 Shortness of breath: Secondary | ICD-10-CM | POA: Insufficient documentation

## 2018-12-21 DIAGNOSIS — R05 Cough: Secondary | ICD-10-CM | POA: Insufficient documentation

## 2018-12-21 LAB — GROUP A STREP BY PCR: GROUP A STREP BY PCR: NOT DETECTED

## 2018-12-21 NOTE — ED Triage Notes (Signed)
Pt c/o sore throat and cough for 3 days. Reports feels too bad to go to work yesterday.

## 2018-12-21 NOTE — ED Provider Notes (Signed)
[facility]-EMERGENCY DEPT Provider Note   CSN: [id number] Arrival date & time: 12/21/18  0957     History   Chief Complaint Chief Complaint  Patient presents with  . Sore Throat  . Cough    HPI [name] is a 38 y.o. male.  38 y.o male with a PMH of GERD presents to the ED with a chief complaint of sore throat along with cough x 3 days.  Reports the pain is sharp worse with swallowing and drinking water.  He has tried some Hall's drops along with some vapor rub and states no relieving symptoms.  He does report being around sick people at home.  Patient also reports a productive cough with yellow to green sputum.  He also endorses some shortness of breath specially with deep inspiration along with some chest pain "not like him having a hard time ".  He denies any alleviating actors.  He denies any fever, abdominal pain, or headaches.     Past Medical History:  Diagnosis Date  . GERD (gastroesophageal reflux disease)   . Hilar lymphadenopathy   . Mediastinal lymphadenopathy   . Sarcoidosis   . Tobacco abuse     Patient Active Problem List   Diagnosis Date Noted  . Suicidal ideations   . Cannabis dependence with physiological dependence (HCC) 01/16/2016  . Severe major depression, single episode, without psychotic features (HCC) 01/16/2016  . Major depressive disorder, recurrent, severe without psychotic features (HCC) 01/16/2016  . Abdominal wall strain 05/06/2014  . Sarcoidosis 05/22/2011    Past Surgical History:  Procedure Laterality Date  . BRONCHOSCOPY          Home Medications    Prior to Admission medications   Medication Sig Start Date End Date Taking? Authorizing Provider  naproxen sodium (ALEVE) 220 MG tablet Take 220 mg by mouth as needed (headache).   Yes [provider]  traZODone (DESYREL) 50 MG tablet Take 1 tablet (50 mg total) by mouth at bedtime as needed for sleep (May repeat x1). Patient not taking:  Reported on 12/21/2018 01/18/16   Oneta Rack, NP    Family History Family History  Problem Relation Age of Onset  . Coronary artery disease Other   . Heart attack Father        55    Social History Social History   Tobacco Use  . Smoking status: Current Every Day Smoker    Types: Cigarettes  . Smokeless tobacco: Never Used  Substance Use Topics  . Alcohol use: No    Alcohol/week: 6.0 standard drinks    Types: 2 Cans of beer, 4 Standard drinks or equivalent per week    Comment: on weekends  . Drug use: Yes    Types: Marijuana     Allergies   Patient has no known allergies.   Review of Systems Review of Systems  Constitutional: Negative for fever.  HENT: Positive for sore throat. Negative for rhinorrhea, sinus pressure, sinus pain and sneezing.   Gastrointestinal: Negative for abdominal pain.     Physical Exam Updated Vital Signs BP 117/78 (BP Location: Right Arm)   Pulse (!) 50   Temp 98.5 F (36.9 C) (Oral)   Resp 17   SpO2 99%   Physical Exam Vitals signs and nursing note reviewed.  Constitutional:      Appearance: He is well-developed.  HENT:     Head: Normocephalic and atraumatic.     Mouth/Throat:     Mouth: Mucous membranes  are moist.     Tonsils: Tonsillar exudate present. Swelling: 2+ on the right. 1+ on the left.     Comments: Small pinpoint white exudates on right tonsil > left. Significant cervical lymphadenopathy.  Eyes:     General: No scleral icterus.    Pupils: Pupils are equal, round, and reactive to light.  Neck:     Musculoskeletal: Normal range of motion.  Cardiovascular:     Rate and Rhythm: Normal rate.     Heart sounds: Normal heart sounds.  Pulmonary:     Effort: Pulmonary effort is normal.     Breath sounds: Examination of the right-middle field reveals decreased breath sounds. Examination of the left-lower field reveals decreased breath sounds. Decreased breath sounds present. No wheezing.  Chest:     Chest wall: No  tenderness.  Abdominal:     General: Bowel sounds are normal. There is no distension.     Palpations: Abdomen is soft.     Tenderness: There is no abdominal tenderness.  Musculoskeletal:        General: No tenderness or deformity.  Skin:    General: Skin is warm and dry.  Neurological:     Mental Status: He is alert and oriented to person, place, and time.      ED Treatments / Results  Labs (all labs ordered are listed, but only abnormal results are displayed) Labs Reviewed  GROUP A STREP BY PCR    EKG None  Radiology Dg Chest 2 View  Result Date: 12/21/2018 CLINICAL DATA:  Shortness of breath.  Cough. EXAM: CHEST - 2 VIEW COMPARISON:  05/06/2014 FINDINGS: The heart size and mediastinal contours are within normal limits. Both lungs are clear. The visualized skeletal structures are unremarkable. IMPRESSION: Normal exam. Electronically Signed   By: Francene Boyers M.D.   On: 12/21/2018 13:31    Procedures Procedures (including critical care time)  Medications Ordered in ED Medications - No data to display   Initial Impression / Assessment and Plan / ED Course  I have reviewed the triage vital signs and the nursing notes.  Pertinent labs & imaging results that were available during my care of the patient were reviewed by me and considered in my medical decision making (see chart for details).    Patient presents with sore throat and cough x 3 days. States no relieve with OTC medications.  Been afebrile, reports no improvement in symptoms with over-the-counter therapy.  During evaluation lung sounds are decreased but no wheezing is appreciated.  Heart rate is stable but patient is resting in bed comfortably. PCR was negative for strep pharyngitis, at this time symptoms likely viral. Xray was negative for pneumonia or acute process. At this time will write patient with a work note to return back to work tomorrow.  Vitals stable during visit, afebrile low suspicion for any other  acute process.  Return precautions provided.    Final Clinical Impressions(s) / ED Diagnoses   Final diagnoses:  Sore throat    ED Discharge Orders    None       Claude Manges, PA-C 12/21/18 1342    Virgina Norfolk, DO 12/21/18 1635

## 2019-07-25 DIAGNOSIS — Z5321 Procedure and treatment not carried out due to patient leaving prior to being seen by health care provider: Secondary | ICD-10-CM | POA: Insufficient documentation

## 2019-07-26 ENCOUNTER — Emergency Department (HOSPITAL_COMMUNITY)
Admission: EM | Admit: 2019-07-26 | Discharge: 2019-07-26 | Discharge status: Against Medical Advice | Payer: Self-pay | Attending: Emergency Medicine | Admitting: Emergency Medicine

## 2019-07-26 ENCOUNTER — Encounter (HOSPITAL_COMMUNITY): Payer: Self-pay | Admitting: Family Medicine

## 2019-07-26 NOTE — ED Notes (Signed)
Pt not seen in lobby 

## 2019-07-26 NOTE — ED Notes (Signed)
Called pt from lobby x1 No response 

## 2019-07-26 NOTE — ED Triage Notes (Signed)
Patient states he needs a work note. Patient states he had diarrhea and vomiting yesterday but does not want to be seen for those symptoms. He states he has been using Ginger Tea to help with his "stomach virus".

## 2019-09-28 ENCOUNTER — Other Ambulatory Visit: Payer: Self-pay

## 2019-09-28 ENCOUNTER — Emergency Department (HOSPITAL_COMMUNITY)
Admission: EM | Admit: 2019-09-28 | Discharge: 2019-09-28 | Disposition: A | Discharge status: Home / Self Care | Payer: Self-pay | Attending: Emergency Medicine | Admitting: Emergency Medicine

## 2019-09-28 DIAGNOSIS — R369 Urethral discharge, unspecified: Secondary | ICD-10-CM

## 2019-09-28 DIAGNOSIS — R102 Pelvic and perineal pain: Secondary | ICD-10-CM | POA: Insufficient documentation

## 2019-09-28 DIAGNOSIS — F17210 Nicotine dependence, cigarettes, uncomplicated: Secondary | ICD-10-CM | POA: Insufficient documentation

## 2019-09-28 LAB — URINALYSIS, ROUTINE W REFLEX MICROSCOPIC
Bilirubin Urine: NEGATIVE
Glucose, UA: NEGATIVE mg/dL
Hgb urine dipstick: NEGATIVE
Ketones, ur: NEGATIVE mg/dL
Nitrite: NEGATIVE
Protein, ur: NEGATIVE mg/dL
Specific Gravity, Urine: 1.018 (ref 1.005–1.030)
pH: 5 (ref 5.0–8.0)

## 2019-09-28 LAB — HIV ANTIBODY (ROUTINE TESTING W REFLEX): HIV Screen 4th Generation wRfx: NONREACTIVE

## 2019-09-28 LAB — RPR: RPR Ser Ql: NONREACTIVE

## 2019-09-28 MED ORDER — CEFTRIAXONE SODIUM 250 MG IJ SOLR
250.0000 mg | Freq: Once | INTRAMUSCULAR | Status: AC
  Administered 2019-09-28: 08:00:00 250 mg via INTRAMUSCULAR
  Filled 2019-09-28: qty 250

## 2019-09-28 MED ORDER — AZITHROMYCIN 250 MG PO TABS
1000.0000 mg | ORAL_TABLET | Freq: Once | ORAL | Status: AC
  Administered 2019-09-28: 08:00:00 1000 mg via ORAL
  Filled 2019-09-28: qty 4

## 2019-09-28 MED ORDER — LIDOCAINE HCL 1 % IJ SOLN
INTRAMUSCULAR | Status: AC
  Filled 2019-09-28: qty 20

## 2019-09-28 NOTE — ED Provider Notes (Signed)
[facility]-EMERGENCY DEPT Provider Note   CSN: [id number] Arrival date & time: 09/28/19  0414     History   Chief Complaint Chief Complaint  Patient presents with  . Penile Discharge    HPI [name] is a 38 y.o. male no significant medical history.   Presenting for 2 days of dysuria and penile discharge that is green.  Patient states he had unprotected sex with a male 3 days ago.  States that apart from discharge and some mild pelvic pain has no other symptoms.  Patient states he has no history of sexually transmitted diseases in the past.    No fever, chills, NV, no chest pain or SOB.  Denies sore throat.      HPI  Past Medical History:  Diagnosis Date  . GERD (gastroesophageal reflux disease)   . Hilar lymphadenopathy   . Mediastinal lymphadenopathy   . Sarcoidosis   . Tobacco abuse     Patient Active Problem List   Diagnosis Date Noted  . Suicidal ideations   . Cannabis dependence with physiological dependence (HCC) 01/16/2016  . Severe major depression, single episode, without psychotic features (HCC) 01/16/2016  . Major depressive disorder, recurrent, severe without psychotic features (HCC) 01/16/2016  . Abdominal wall strain 05/06/2014  . Sarcoidosis 05/22/2011    Past Surgical History:  Procedure Laterality Date  . BRONCHOSCOPY          Home Medications    Prior to Admission medications   Medication Sig Start Date End Date Taking? Authorizing Provider  naproxen sodium (ALEVE) 220 MG tablet Take 220 mg by mouth as needed (headache).    [provider]  traZODone (DESYREL) 50 MG tablet Take 1 tablet (50 mg total) by mouth at bedtime as needed for sleep (May repeat x1). Patient not taking: Reported on 12/21/2018 01/18/16   Oneta Rack, NP    Family History Family History  Problem Relation Age of Onset  . Coronary artery disease Other   . Heart attack Father        78    Social History Social History   Tobacco Use  . Smoking status: Current Every Day Smoker    Types: Cigars  . Smokeless tobacco: Never Used  Substance Use Topics  . Alcohol use: Yes    Alcohol/week: 6.0 standard drinks    Types: 2 Cans of beer, 4 Standard drinks or equivalent per week    Comment: on weekends  . Drug use: Yes    Types: Marijuana    Comment: Daily      Allergies   Patient has no known allergies.   Review of Systems Review of Systems  Constitutional: Negative for chills and fever.  Respiratory: Negative for shortness of breath.   Cardiovascular: Negative for chest pain.  Genitourinary: Positive for discharge. Negative for frequency, penile swelling, scrotal swelling and testicular pain.  Musculoskeletal: Negative for myalgias.     Physical Exam Updated Vital Signs BP 127/73   Pulse (!) 55   Temp (!) 97.3 F (36.3 C)   Resp 16   SpO2 98%   Physical Exam Vitals signs and nursing note reviewed. Exam conducted with a chaperone present.  Constitutional:      General: He is not in acute distress.    Appearance: Normal appearance. He is not ill-appearing.  HENT:     Head: Normocephalic and atraumatic.     Mouth/Throat:     Mouth: Mucous membranes are moist.  Eyes:  General: No scleral icterus.       Right eye: No discharge.        Left eye: No discharge.     Conjunctiva/sclera: Conjunctivae normal.  Cardiovascular:     Pulses: Normal pulses.  Pulmonary:     Effort: Pulmonary effort is normal.     Breath sounds: No stridor.  Genitourinary:    Comments: No tenderness to palpation of testicles, penis, no inguinal lymphadenopathy.  Scant green discharge present at external urethral meatus. Neurological:     Mental Status: He is alert and oriented to person, place, and time. Mental status is at baseline.      ED Treatments / Results  Labs (all labs ordered are listed, but only abnormal results are displayed) Labs Reviewed  URINALYSIS, ROUTINE W REFLEX MICROSCOPIC  RPR  HIV  ANTIBODY (ROUTINE TESTING W REFLEX)  HIV4GL SAVE TUBE  GC/CHLAMYDIA PROBE AMP ([place]) NOT AT [facility]    EKG None  Radiology No results found.  Procedures Procedures (including critical care time)  Medications Ordered in ED Medications  cefTRIAXone (ROCEPHIN) injection 250 mg (has no administration in time range)  azithromycin (ZITHROMAX) tablet 1,000 mg (has no administration in time range)     Initial Impression / Assessment and Plan / ED Course  I have reviewed the triage vital signs and the nursing notes.  Pertinent labs & imaging results that were available during my care of the patient were reviewed by me and considered in my medical decision making (see chart for details).        Patient has history of physical exam consistent with gonorrhea or chlamydia infection.  Will empirically treat patient with ceftriaxone and azithromycin.  Urine test results.  Discussed with patient need to use protection in the future.  Discussed sexual health and need to inform partner of symptoms so that she can be treated.   No concern for other acute process.  Patient has vitals within normal limits other than mild bradycardia that is without dizziness or light headedness.     Final Clinical Impressions(s) / ED Diagnoses   Final diagnoses:  Penile discharge    ED Discharge Orders    None       Tedd Sias, [place] 09/28/19 2751    Veryl Speak, MD 10/01/19 [phone]

## 2019-09-28 NOTE — ED Triage Notes (Signed)
White penile discharge and burning with urination.

## 2019-09-29 LAB — GC/CHLAMYDIA PROBE AMP (~~LOC~~) NOT AT ARMC
Chlamydia: NEGATIVE
Neisseria Gonorrhea: POSITIVE — AB

## 2021-03-04 ENCOUNTER — Other Ambulatory Visit: Payer: Self-pay

## 2021-03-04 ENCOUNTER — Encounter (HOSPITAL_COMMUNITY): Payer: Self-pay

## 2021-03-04 ENCOUNTER — Emergency Department (HOSPITAL_COMMUNITY): Payer: Self-pay

## 2021-03-04 ENCOUNTER — Emergency Department (HOSPITAL_COMMUNITY)
Admission: EM | Admit: 2021-03-04 | Discharge: 2021-03-04 | Disposition: A | Discharge status: Home / Self Care | Payer: Self-pay | Attending: Emergency Medicine | Admitting: Emergency Medicine

## 2021-03-04 DIAGNOSIS — F17290 Nicotine dependence, other tobacco product, uncomplicated: Secondary | ICD-10-CM | POA: Insufficient documentation

## 2021-03-04 DIAGNOSIS — M5412 Radiculopathy, cervical region: Secondary | ICD-10-CM | POA: Insufficient documentation

## 2021-03-04 MED ORDER — METHOCARBAMOL 500 MG PO TABS: 500.0000 mg | ORAL_TABLET | Freq: Two times a day (BID) | ORAL | 0 refills | Status: DC

## 2021-03-04 MED ORDER — PREDNISONE 10 MG (21) PO TBPK: ORAL_TABLET | Freq: Every day | ORAL | 0 refills | Status: DC

## 2021-03-04 MED ORDER — METHOCARBAMOL 500 MG PO TABS
500.0000 mg | ORAL_TABLET | Freq: Once | ORAL | Status: AC
  Administered 2021-03-04: 500 mg via ORAL
  Filled 2021-03-04: qty 1

## 2021-03-04 MED ORDER — OXYCODONE-ACETAMINOPHEN 5-325 MG PO TABS
1.0000 | ORAL_TABLET | Freq: Once | ORAL | Status: AC
  Administered 2021-03-04: 1 via ORAL
  Filled 2021-03-04: qty 1

## 2021-03-04 NOTE — ED Triage Notes (Signed)
Pt reports right sided neck pain/ stiffness for several months.

## 2021-03-04 NOTE — ED Provider Notes (Signed)
[facility]-EMERGENCY DEPT Provider Note   CSN: [id number] Arrival date & time: 03/04/21  2027     History Chief Complaint  Patient presents with  . Neck Pain    [name] is a 40 y.o. male who presents to ED with a chief complaint of neck pain.  States that for the past few months he has had right-sided neck pain that is causing sharp shooting pain down his right arm.  He states that this worsened today.  He has tried NSAIDs with only minimal improvement in his symptoms.  He cannot recall any incident that may have caused this pain a couple months ago when it started.  He denies any fever, numbness or weakness, vision changes, prior neck surgeries.  HPI     Past Medical History:  Diagnosis Date  . GERD (gastroesophageal reflux disease)   . Hilar lymphadenopathy   . Mediastinal lymphadenopathy   . Sarcoidosis   . Tobacco abuse     Patient Active Problem List   Diagnosis Date Noted  . Suicidal ideations   . Cannabis dependence with physiological dependence (HCC) 01/16/2016  . Severe major depression, single episode, without psychotic features (HCC) 01/16/2016  . Major depressive disorder, recurrent, severe without psychotic features (HCC) 01/16/2016  . Abdominal wall strain 05/06/2014  . Sarcoidosis 05/22/2011    Past Surgical History:  Procedure Laterality Date  . BRONCHOSCOPY         Family History  Problem Relation Age of Onset  . Coronary artery disease Other   . Heart attack Father        73    Social History   Tobacco Use  . Smoking status: Current Every Day Smoker    Types: Cigars  . Smokeless tobacco: Never Used  Vaping Use  . Vaping Use: Never used  Substance Use Topics  . Alcohol use: Yes    Alcohol/week: 6.0 standard drinks    Types: 2 Cans of beer, 4 Standard drinks or equivalent per week    Comment: on weekends  . Drug use: Yes    Types: Marijuana    Comment: Daily     Home Medications Prior to Admission  medications   Medication Sig Start Date End Date Taking? Authorizing Provider  methocarbamol (ROBAXIN) 500 MG tablet Take 1 tablet (500 mg total) by mouth 2 (two) times daily. 03/04/21  Yes , , PA-C  predniSONE (STERAPRED UNI-PAK 21 TAB) 10 MG (21) TBPK tablet Take by mouth daily. Take 6 tabs by mouth daily  for 2 days, then 5 tabs for 2 days, then 4 tabs for 2 days, then 3 tabs for 2 days, 2 tabs for 2 days, then 1 tab by mouth daily for 2 days 03/04/21  Yes , , PA-C  naproxen sodium (ALEVE) 220 MG tablet Take 220 mg by mouth as needed (headache).    [provider]  traZODone (DESYREL) 50 MG tablet Take 1 tablet (50 mg total) by mouth at bedtime as needed for sleep (May repeat x1). Patient not taking: Reported on 12/21/2018 01/18/16   Oneta Rack, NP    Allergies    Patient has no known allergies.  Review of Systems   Review of Systems  Constitutional: Negative for chills and fever.  Musculoskeletal: Positive for myalgias and neck pain.  Neurological: Negative for weakness and numbness.    Physical Exam Updated Vital Signs BP 115/80 (BP Location: Left Arm)   Pulse (!) 56   Temp 98.3 F (36.8 C) (  Oral)   Resp 18   Ht 6' (1.829 m)   Wt 88.5 kg   SpO2 98%   BMI 26.45 kg/m   Physical Exam Vitals and nursing note reviewed.  Constitutional:      General: He is not in acute distress.    Appearance: He is well-developed. He is not diaphoretic.  HENT:     Head: Normocephalic and atraumatic.  Eyes:     General: No scleral icterus.    Conjunctiva/sclera: Conjunctivae normal.  Neck:      Comments: Tenderness palpation of the cervical spine the midline paraspinal musculature.  Strength 5/5 in bilateral upper extremities.  No meningeal signs.  Normal sensation to light touch of bilateral upper extremities. Pulmonary:     Effort: Pulmonary effort is normal. No respiratory distress.  Musculoskeletal:     Cervical back: Normal range of motion. Spinous  process tenderness and muscular tenderness present.  Skin:    Findings: No rash.  Neurological:     Mental Status: He is alert.     ED Results / Procedures / Treatments   Labs (all labs ordered are listed, but only abnormal results are displayed) Labs Reviewed - No data to display  EKG None  Radiology CT Cervical Spine Wo Contrast  Result Date: 03/04/2021 CLINICAL DATA:  Redemonstration of a borderline enlarged common bile duct measuring up to 8 mm. This can be seen in the post cholecystectomy setting. EXAM: CT CERVICAL SPINE WITHOUT CONTRAST TECHNIQUE: Multidetector CT imaging of the cervical spine was performed without intravenous contrast. Multiplanar CT image reconstructions were also generated. COMPARISON:  None. FINDINGS: Alignment: Normal. Skull base and vertebrae: Multilevel uncovertebral arthropathy and osteophyte formation leading to at least mild osseous neural foraminal stenosis bilaterally at the C5-C6 level and left C4-C5 level. No acute fracture. No aggressive appearing focal osseous lesion or focal pathologic process. Soft tissues and spinal canal: No prevertebral fluid or swelling. No visible canal hematoma. Upper chest: Unremarkable. Other: None. IMPRESSION: 1. Multilevel uncovertebral arthropathy and osteophyte formation leading to at least mild osseous neural foraminal stenosis bilaterally at the C5-C6 level and left C4-C5 level. 2. No acute displaced fracture or traumatic listhesis of the cervical spine. Electronically Signed   By: Tish Frederickson M.D.   On: 03/04/2021 23:23    Procedures Procedures   Medications Ordered in ED Medications  methocarbamol (ROBAXIN) tablet 500 mg (500 mg Oral Given 03/04/21 2240)  oxyCODONE-acetaminophen (PERCOCET/ROXICET) 5-325 MG per tablet 1 tablet (1 tablet Oral Given 03/04/21 2240)    ED Course  I have reviewed the triage vital signs and the nursing notes.  Pertinent labs & imaging results that were available during my care of  the patient were reviewed by me and considered in my medical decision making (see chart for details).    MDM Rules/Calculators/A&P                          40 year old male presenting to the ED with a chief complaint of neck pain.  Reports several month history of right-sided neck pain and states that today "I just could not take it."  He has tried NSAIDs with only minimal improvement in his symptoms.  He denies any incident that may have caused his pain.  Denies any headache, fever, numbness or weakness, prior neck surgeries or vision changes.  On exam there is some tenderness palpation of the right side of the neck.  He is able to perform range of motion  but states that he has pain with this.  Reports sharp shooting pain down his right arm.  No numbness or weakness noted on exam of bilateral upper extremities.  He remains ambulatory here.  He is hemodynamically stable.  CT of the cervical spine shows multilevel osteophyte formation and mild neural foraminal stenosis.  His extremities are neurovascularly intact so I doubt any surgical or emergent treatment needed at this time.  But I do suspect this is the cause of his ongoing pain.  Will treat with steroids to help with radicular symptoms as well as muscle relaxer.  Patient provided with neurosurgery follow-up.  He appears much more improved with medications given here.  Return precautions given   Patient is hemodynamically stable, in NAD, and able to ambulate in the ED. Evaluation does not show pathology that would require ongoing emergent intervention or inpatient treatment. I explained the diagnosis to the patient. Pain has been managed and has no complaints prior to discharge. Patient is comfortable with above plan and is stable for discharge at this time. All questions were answered prior to disposition. Strict return precautions for returning to the ED were discussed. Encouraged follow up with PCP.   An After Visit Summary was printed and given to  the patient.   Portions of this note were generated with Scientist, clinical (histocompatibility and immunogenetics). Dictation errors may occur despite best attempts at proofreading.  Final Clinical Impression(s) / ED Diagnoses Final diagnoses:  Cervical radiculopathy    Rx / DC Orders ED Discharge Orders         Ordered    predniSONE (STERAPRED UNI-PAK 21 TAB) 10 MG (21) TBPK tablet  Daily        03/04/21 2330    methocarbamol (ROBAXIN) 500 MG tablet  2 times daily        03/04/21 2330           Dietrich Pates, PA-C 03/04/21 2333    Pollyann Savoy, MD 03/05/21 1416

## 2021-03-04 NOTE — Discharge Instructions (Signed)
Take the medications as prescribed.  Take the steroids to help with the radicular symptoms you are having. Follow-up with the neurosurgeon listed below. Return to the ER if you start to experience worsening pain, injuries or falls, numbness in arms or legs, blurry vision or headache.

## 2021-05-30 ENCOUNTER — Other Ambulatory Visit: Payer: Self-pay

## 2021-05-30 ENCOUNTER — Emergency Department (HOSPITAL_COMMUNITY)
Admission: EM | Admit: 2021-05-30 | Discharge: 2021-05-30 | Disposition: A | Discharge status: Home / Self Care | Payer: Self-pay | Attending: Emergency Medicine | Admitting: Emergency Medicine

## 2021-05-30 ENCOUNTER — Emergency Department (HOSPITAL_COMMUNITY): Payer: Self-pay

## 2021-05-30 ENCOUNTER — Encounter (HOSPITAL_COMMUNITY): Payer: Self-pay | Admitting: Student

## 2021-05-30 DIAGNOSIS — J189 Pneumonia, unspecified organism: Secondary | ICD-10-CM

## 2021-05-30 DIAGNOSIS — K219 Gastro-esophageal reflux disease without esophagitis: Secondary | ICD-10-CM | POA: Insufficient documentation

## 2021-05-30 DIAGNOSIS — R079 Chest pain, unspecified: Secondary | ICD-10-CM

## 2021-05-30 DIAGNOSIS — J181 Lobar pneumonia, unspecified organism: Secondary | ICD-10-CM | POA: Insufficient documentation

## 2021-05-30 DIAGNOSIS — Z87891 Personal history of nicotine dependence: Secondary | ICD-10-CM | POA: Insufficient documentation

## 2021-05-30 LAB — BASIC METABOLIC PANEL
Anion gap: 7 (ref 5–15)
BUN: 17 mg/dL (ref 6–20)
CO2: 25 mmol/L (ref 22–32)
Calcium: 8.9 mg/dL (ref 8.9–10.3)
Chloride: 104 mmol/L (ref 98–111)
Creatinine, Ser: 1.01 mg/dL (ref 0.61–1.24)
GFR, Estimated: >60 mL/min (ref >60)
Glucose, Bld: 96 mg/dL (ref 70–99)
Potassium: 4.1 mmol/L (ref 3.5–5.1)
Sodium: 136 mmol/L (ref 135–145)

## 2021-05-30 LAB — TROPONIN I (HIGH SENSITIVITY)
Troponin I (High Sensitivity): 3 ng/L (ref <18)
Troponin I (High Sensitivity): 3 ng/L (ref <18)

## 2021-05-30 LAB — CBC
HCT: 45.3 % (ref 39.0–52.0)
Hemoglobin: 15.1 g/dL (ref 13.0–17.0)
MCH: 30.9 pg (ref 26.0–34.0)
MCHC: 33.3 g/dL (ref 30.0–36.0)
MCV: 92.8 fL (ref 80.0–100.0)
Platelets: 176 10*3/uL (ref 150–400)
RBC: 4.88 MIL/uL (ref 4.22–5.81)
RDW: 13.3 % (ref 11.5–15.5)
WBC: 5.6 10*3/uL (ref 4.0–10.5)
nRBC: 0 % (ref 0.0–0.2)

## 2021-05-30 MED ORDER — CLARITHROMYCIN 500 MG PO TABS: 500.0000 mg | ORAL_TABLET | Freq: Two times a day (BID) | ORAL | 0 refills | Status: DC

## 2021-05-30 MED ORDER — AMOXICILLIN 500 MG PO CAPS: 1000.0000 mg | ORAL_CAPSULE | Freq: Three times a day (TID) | ORAL | 0 refills | Status: DC

## 2021-05-30 MED ORDER — ALUM & MAG HYDROXIDE-SIMETH 200-200-20 MG/5ML PO SUSP
30.0000 mL | Freq: Once | ORAL | Status: AC
  Administered 2021-05-30: 30 mL via ORAL
  Filled 2021-05-30: qty 30

## 2021-05-30 MED ORDER — LIDOCAINE VISCOUS HCL 2 % MT SOLN
15.0000 mL | Freq: Once | OROMUCOSAL | Status: AC
  Administered 2021-05-30: 15 mL via ORAL
  Filled 2021-05-30: qty 15

## 2021-05-30 NOTE — ED Provider Notes (Signed)
[facility]-EMERGENCY DEPT Provider Note   CSN: [id number] Arrival date & time: 05/30/21  4431     History Chief Complaint  Patient presents with   Chest Pain    [name] is a 40 y.o. male.  HPI  Patient with history of GERD, sarcoidosis, and MDD presents for chest pain x3 days.  Pain is intermittent, midsternal, does not seem to follow any pattern.  It occurred once during sexual intercourse, once at rest, once while standing at work.  He is not able to say if it is worse when he is laying flat or improved when leaning forward.  When the pain occurs, it tends to last for about 15 minutes.  He is unable to say if if what the pain feels like, but describes it as something between stabbing and burning.  There is no associated nausea or vomiting.  He quit smoking cigarettes in January, but still smokes weed.  Does not have a history of hypertension.  No history of previous MIs, blood clots, stroke.  He is not having shortness of breath.  Past Medical History:  Diagnosis Date   GERD (gastroesophageal reflux disease)    Hilar lymphadenopathy    Mediastinal lymphadenopathy    Sarcoidosis    Tobacco abuse     Patient Active Problem List   Diagnosis Date Noted   Suicidal ideations    Cannabis dependence with physiological dependence (HCC) 01/16/2016   Severe major depression, single episode, without psychotic features (HCC) 01/16/2016   Major depressive disorder, recurrent, severe without psychotic features (HCC) 01/16/2016   Abdominal wall strain 05/06/2014   Sarcoidosis 05/22/2011    Past Surgical History:  Procedure Laterality Date   BRONCHOSCOPY         Family History  Problem Relation Age of Onset   Coronary artery disease Other    Heart attack Father        80    Social History   Tobacco Use   Smoking status: Former    Pack years: 0.00    Types: Cigars    Quit date: 12/14/2020    Years since quitting: 0.4   Smokeless tobacco: Never   Vaping Use   Vaping Use: Never used  Substance Use Topics   Alcohol use: Yes    Alcohol/week: 6.0 standard drinks    Types: 2 Cans of beer, 4 Standard drinks or equivalent per week    Comment: on weekends   Drug use: Yes    Types: Marijuana    Comment: Daily     Home Medications Prior to Admission medications   Medication Sig Start Date End Date Taking? Authorizing Provider  amoxicillin (AMOXIL) 500 MG capsule Take 2 capsules (1,000 mg total) by mouth 3 (three) times daily. 05/30/21  Yes Theron Arista, PA-C  clarithromycin (BIAXIN) 500 MG tablet Take 1 tablet (500 mg total) by mouth 2 (two) times daily. 05/30/21  Yes Theron Arista, PA-C  methocarbamol (ROBAXIN) 500 MG tablet Take 1 tablet (500 mg total) by mouth 2 (two) times daily. 03/04/21   Khatri, Hina, PA-C  naproxen sodium (ALEVE) 220 MG tablet Take 220 mg by mouth as needed (headache).    [provider]  predniSONE (STERAPRED UNI-PAK 21 TAB) 10 MG (21) TBPK tablet Take by mouth daily. Take 6 tabs by mouth daily  for 2 days, then 5 tabs for 2 days, then 4 tabs for 2 days, then 3 tabs for 2 days, 2 tabs for 2 days, then 1 tab by  mouth daily for 2 days 03/04/21   Dietrich Pates, PA-C  traZODone (DESYREL) 50 MG tablet Take 1 tablet (50 mg total) by mouth at bedtime as needed for sleep (May repeat x1). Patient not taking: Reported on 12/21/2018 01/18/16   Oneta Rack, NP    Allergies    Patient has no known allergies.  Review of Systems   Review of Systems  Constitutional:  Negative for chills and fever.  HENT:  Negative for ear pain and sore throat.   Eyes:  Negative for pain and visual disturbance.  Respiratory:  Negative for cough and shortness of breath.   Cardiovascular:  Positive for chest pain. Negative for palpitations.  Gastrointestinal:  Negative for abdominal pain, nausea and vomiting.  Genitourinary:  Negative for dysuria and hematuria.  Musculoskeletal:  Negative for arthralgias and back pain.  Skin:  Negative  for color change and rash.  Neurological:  Negative for seizures and syncope.  All other systems reviewed and are negative.  Physical Exam Updated Vital Signs BP 117/76   Pulse (!) 47   Temp (!) 97.4 F (36.3 C) (Oral)   Resp (!) 24   Ht 6' (1.829 m)   Wt 89.8 kg   SpO2 100%   BMI 26.85 kg/m   Physical Exam Vitals and nursing note reviewed. Exam conducted with a chaperone present.  Constitutional:      Appearance: Normal appearance. He is well-developed. He is not ill-appearing.  HENT:     Head: Normocephalic and atraumatic.  Eyes:     General: No scleral icterus.       Right eye: No discharge.        Left eye: No discharge.     Extraocular Movements: Extraocular movements intact.     Pupils: Pupils are equal, round, and reactive to light.  Cardiovascular:     Rate and Rhythm: Regular rhythm. Bradycardia present.     Pulses: Normal pulses.     Heart sounds: Normal heart sounds. No murmur heard.   No friction rub. No gallop.  Pulmonary:     Effort: Pulmonary effort is normal. No respiratory distress.     Breath sounds: Normal breath sounds.     Comments: Speaking in complete sentences.  Lungs are clear to auscultation bilaterally. Abdominal:     General: Abdomen is flat. Bowel sounds are normal. There is no distension.     Palpations: Abdomen is soft.     Tenderness: There is no abdominal tenderness.  Musculoskeletal:     Comments: No edema, leg swelling.  Skin:    General: Skin is warm and dry.     Coloration: Skin is not jaundiced.  Neurological:     Mental Status: He is alert. Mental status is at baseline.     Coordination: Coordination normal.    ED Results / Procedures / Treatments   Labs (all labs ordered are listed, but only abnormal results are displayed) Labs Reviewed  BASIC METABOLIC PANEL  CBC  TROPONIN I (HIGH SENSITIVITY)  TROPONIN I (HIGH SENSITIVITY)    EKG EKG Interpretation  Date/Time:  Friday May 30 2021 10:07:02 EDT Ventricular  Rate:  50 PR Interval:  173 QRS Duration: 85 QT Interval:  416 QTC Calculation: 380 R Axis:   59 Text Interpretation: Sinus rhythm ST elevation suggests acute pericarditis since last tracing no significant change Confirmed by Mancel Bale [phone]) on 05/30/2021 1:50:38 PM  Radiology DG Chest 2 View  Result Date: 05/30/2021 CLINICAL DATA:  Chest pain. Additional history  provided: Patient reports central chest pain for 3 days. History of acid reflux. EXAM: CHEST - 2 VIEW COMPARISON:  Prior chest radiographs 12/21/2018 and earlier. FINDINGS: Heart size within normal limits. Mild ill-defined opacity within the right lung base. The left lung is clear. No evidence of pleural effusion or pneumothorax. No acute bony abnormality identified. IMPRESSION: Mild ill-defined opacity within the right lung base, favored to reflect atelectasis. Correlate clinically to exclude any signs or symptoms that would suggest pneumonia. Electronically Signed   By: Jackey LogeKyle  Golden DO   On: 05/30/2021 11:19    Procedures Procedures   Medications Ordered in ED Medications  alum & mag hydroxide-simeth (MAALOX/MYLANTA) 200-200-20 MG/5ML suspension 30 mL (30 mLs Oral Given 05/30/21 1123)    And  lidocaine (XYLOCAINE) 2 % viscous mouth solution 15 mL (15 mLs Oral Given 05/30/21 1123)    ED Course  I have reviewed the triage vital signs and the nursing notes.  Pertinent labs & imaging results that were available during my care of the patient were reviewed by me and considered in my medical decision making (see chart for details).  Clinical Course as of 05/30/21 1351  Fri May 30, 2021  1224 Patient heart rate is low, but he is not having dizziness or feeling lightheaded.  [HS]  1225 Basic metabolic panel No electrolyte derangement, no AKI [HS]  1225 Hemoglobin: 15.1 Not anemic  [HS]  1225 WBC: 5.6 [HS]  1230 Patient reports resolution of his chest pain. He feels much better than he did when he initially presented.  [HS]   1304 Will check second troponin. If it comes back low I think the patient is appropriate for discharge given resolution of pain, low heart score, and reassuring workup and stable vitals. Bradycardia is not overly concerning given his lack of associated symptoms.  [HS]  1306 Resp(!): 23 Re-eval: 18 [HS]  1308 DG Chest 2 View Some findings suspicious of PNA. I asked patient about fevers, congestions, cough and he denies any symptoms. He does say the chest pain feels pleuritic now.  [HS]  1346 Troponin I (High Sensitivity): 3 No elevation.  [HS]    Clinical Course User Index [HS] Theron AristaSage, Shelita Steptoe, PA-C   MDM Rules/Calculators/A&P HEAR Score: 1                        Patient is a 40 year old male with history of GERD, sarcoidosis, MDD presenting for chest pain x3 days.  DDx includes MI, ACS, pericarditis, GERD, anxiety, esophageal rupture.  Given his age and lack of back pain and presentation of this illness, I have a very low suspicion for aortic dissection.  Have ordered basic labs for chest pain work-up, and given him a GI cocktail due to his history of GERD.  Please see ED course for interpretation of labs and imaging.  I doubt pericarditis due to EKG - suspect it is likely repolarization abnormalities. His PE does not match pericarditis.   Reeval; patient reports feeling significantly better.  When discussing the results of the chest x-ray, he states that his chest pain is mostly pleuritic.  He has not had any fevers or cough, but states he has had some intermittent congestion.  I discussed the results are not conclusive of pneumonia, I discussed the risk of being on antibiotics such as resistance, possible allergic reactions.  Patient says he would rather be treated for pneumonia.  Side effects of antibiotics, and patient is agreeable.  Discussed work-up with the  patient.  I suspect his chest pain was largely due to GERD or pneumonia.  Based on his heart score, work-up, resolution of symptoms  I think he is appropriate for discharge.  We discussed return precautions.  Patient voiced understanding and agreement with the plan.  Discussed HPI, physical exam and plan of care for this patient with attending Pieter Partridge. The attending physician evaluated this patient as part of a shared visit and agrees with plan of care.   Final Clinical Impression(s) / ED Diagnoses Final diagnoses:  Nonspecific chest pain  Community acquired pneumonia of right lung, unspecified part of lung    Rx / DC Orders ED Discharge Orders          Ordered    amoxicillin (AMOXIL) 500 MG capsule  3 times daily        05/30/21 1350    clarithromycin (BIAXIN) 500 MG tablet  2 times daily        05/30/21 1350             Theron Arista, PA-C 05/30/21 1353    Koleen Distance, MD 05/30/21 1547

## 2021-05-30 NOTE — ED Triage Notes (Addendum)
Patient BIB POV c/o chest pain X3 day.  Patient states it's in his mid chest and is a aching burning pain last about 15 mins, he states when it hits he cannot move.  Pain is a 6/10 right now.  The other day he says it was a 10/10 pain.  Hx of acid reflex, he takes meds for it over the counter.  During triage, patient states this has happening also when he was having some sexual activity with his girl. Denies smoking, HTN, HLD but does smoke marijuana.  Family hx of MI.

## 2021-05-30 NOTE — Discharge Instructions (Addendum)
You were seen in the emergency department today for chest pain.  Your work-up was reassuring.  I was unable to find any evidence for heart attack, coronary artery disease, infection, electrolyte derangement.  I suspect the pain in your chest was likely due to of reflux disease.  Please continue to take Prilosec twice daily.  Take it 30 minutes before the first meal of the day.  Also had some findings that could be suggestive of pneumonia on your chest x-ray.  Please take amoxicillin 3 times daily for the next 5 days.  Take it with clarithromycin twice daily for the next 5 days.  If you become dizzy, lightheaded, passout, develop a fever, difficulty breathing please return back for to the emergency department for further evaluation.

## 2022-12-28 ENCOUNTER — Other Ambulatory Visit: Payer: Self-pay

## 2022-12-28 DIAGNOSIS — Z20822 Contact with and (suspected) exposure to covid-19: Secondary | ICD-10-CM | POA: Insufficient documentation

## 2022-12-28 DIAGNOSIS — Z87891 Personal history of nicotine dependence: Secondary | ICD-10-CM | POA: Insufficient documentation

## 2022-12-28 DIAGNOSIS — K529 Noninfective gastroenteritis and colitis, unspecified: Secondary | ICD-10-CM | POA: Insufficient documentation

## 2022-12-28 MED ORDER — ONDANSETRON 4 MG PO TBDP: 4.0000 mg | ORAL_TABLET | Freq: Once | ORAL | Status: DC | PRN

## 2022-12-28 NOTE — ED Triage Notes (Signed)
Pt here for N/V/D that started this morning around 0300. Pt states he donates plasma regularly and he thinks he may have contracted a stomach virus from it. Pt reports generalized abd pain.

## 2022-12-29 ENCOUNTER — Emergency Department (HOSPITAL_BASED_OUTPATIENT_CLINIC_OR_DEPARTMENT_OTHER)
Admission: EM | Admit: 2022-12-29 | Discharge: 2022-12-29 | Disposition: A | Discharge status: Home / Self Care | Payer: Self-pay | Attending: Emergency Medicine | Admitting: Emergency Medicine

## 2022-12-29 DIAGNOSIS — K529 Noninfective gastroenteritis and colitis, unspecified: Secondary | ICD-10-CM

## 2022-12-29 LAB — LIPASE, BLOOD: Lipase: 31 U/L (ref 11–51)

## 2022-12-29 LAB — CBC
HCT: 45 % (ref 39.0–52.0)
Hemoglobin: 15.1 g/dL (ref 13.0–17.0)
MCH: 30.3 pg (ref 26.0–34.0)
MCHC: 33.6 g/dL (ref 30.0–36.0)
MCV: 90.2 fL (ref 80.0–100.0)
Platelets: 207 10*3/uL (ref 150–400)
RBC: 4.99 MIL/uL (ref 4.22–5.81)
RDW: 12.6 % (ref 11.5–15.5)
WBC: 6.9 10*3/uL (ref 4.0–10.5)
nRBC: 0 % (ref 0.0–0.2)

## 2022-12-29 LAB — COMPREHENSIVE METABOLIC PANEL
ALT: 31 U/L (ref 0–44)
AST: 26 U/L (ref 15–41)
Albumin: 3.6 g/dL (ref 3.5–5.0)
Alkaline Phosphatase: 35 U/L — ABNORMAL LOW (ref 38–126)
Anion gap: 7 (ref 5–15)
BUN: 20 mg/dL (ref 6–20)
CO2: 23 mmol/L (ref 22–32)
Calcium: 8.1 mg/dL — ABNORMAL LOW (ref 8.9–10.3)
Chloride: 105 mmol/L (ref 98–111)
Creatinine, Ser: 1.05 mg/dL (ref 0.61–1.24)
GFR, Estimated: >60 mL/min (ref >60)
Glucose, Bld: 103 mg/dL — ABNORMAL HIGH (ref 70–99)
Potassium: 3.5 mmol/L (ref 3.5–5.1)
Sodium: 135 mmol/L (ref 135–145)
Total Bilirubin: 0.5 mg/dL (ref 0.3–1.2)
Total Protein: 6 g/dL — ABNORMAL LOW (ref 6.5–8.1)

## 2022-12-29 LAB — RESP PANEL BY RT-PCR (RSV, FLU A&B, COVID)  RVPGX2
Influenza A by PCR: NEGATIVE
Influenza B by PCR: NEGATIVE
Resp Syncytial Virus by PCR: NEGATIVE
SARS Coronavirus 2 by RT PCR: NEGATIVE

## 2022-12-29 MED ORDER — LOPERAMIDE HCL 2 MG PO CAPS
4.0000 mg | ORAL_CAPSULE | Freq: Once | ORAL | Status: AC
  Administered 2022-12-29: 4 mg via ORAL
  Filled 2022-12-29: qty 2

## 2022-12-29 MED ORDER — ONDANSETRON HCL 4 MG/2ML IJ SOLN
4.0000 mg | Freq: Once | INTRAMUSCULAR | Status: AC
  Administered 2022-12-29: 4 mg via INTRAVENOUS
  Filled 2022-12-29: qty 2

## 2022-12-29 MED ORDER — ONDANSETRON 8 MG PO TBDP: 8.0000 mg | ORAL_TABLET | Freq: Three times a day (TID) | ORAL | 0 refills | Status: AC | PRN

## 2022-12-29 MED ORDER — SODIUM CHLORIDE 0.9 % IV BOLUS
2000.0000 mL | Freq: Once | INTRAVENOUS | Status: AC
  Administered 2022-12-29: 2000 mL via INTRAVENOUS

## 2022-12-29 NOTE — ED Provider Notes (Signed)
[facility] Provider Note: Georgena Spurling, MD, FACEP  CSN: [id number] MRN: [id number] ARRIVAL: 12/28/22 at [place]: [place]  N/V/D   HISTORY OF PRESENT ILLNESS  12/29/22 5:27 AM [name] is a 42 y.o. male with nausea, vomiting and diarrhea that started yesterday morning about 3 AM.  He was having generalized, crampy abdominal pain with this which he rated as a 7 out of 10.  That has pretty much subsided.  His nausea has been coming and going and is improving as well.  He is continuing to have watery diarrhea.   Past Medical History:  Diagnosis Date   GERD (gastroesophageal reflux disease)    Hilar lymphadenopathy    Mediastinal lymphadenopathy    Sarcoidosis    Tobacco abuse     Past Surgical History:  Procedure Laterality Date   BRONCHOSCOPY      Family History  Problem Relation Age of Onset   Coronary artery disease Other    Heart attack Father        18    Social History   Tobacco Use   Smoking status: Former    Types: Cigars    Quit date: 12/14/2020    Years since quitting: 2.0   Smokeless tobacco: Never  Vaping Use   Vaping Use: Never used  Substance Use Topics   Alcohol use: Yes    Alcohol/week: 6.0 standard drinks of alcohol    Types: 2 Cans of beer, 4 Standard drinks or equivalent per week    Comment: on weekends   Drug use: Yes    Types: Marijuana    Comment: Daily     Prior to Admission medications   Medication Sig Start Date End Date Taking? Authorizing Provider  ondansetron (ZOFRAN-ODT) 8 MG disintegrating tablet Take 1 tablet (8 mg total) by mouth every 8 (eight) hours as needed for nausea or vomiting. 12/29/22  Yes Takerra Lupinacci, MD    Allergies Patient has no known allergies.   REVIEW OF SYSTEMS  Negative except as noted here or in the History of Present Illness.   PHYSICAL EXAMINATION  Initial Vital Signs Blood pressure 108/82, pulse 67, temperature 98.5 F (36.9 C), temperature source  Oral, resp. rate 18, height 6' (1.829 m), weight 93 kg, SpO2 100 %.  Examination General: Well-developed, well-nourished male in no acute distress; appearance consistent with age of record HENT: normocephalic; atraumatic Eyes: Normal appearance Neck: supple Heart: regular rate and rhythm Lungs: clear to auscultation bilaterally Abdomen: soft; nondistended; nontender; bowel sounds present Extremities: No deformity; full range of motion; pulses normal Neurologic: Awake, alert and oriented; motor function intact in all extremities and symmetric; no facial droop Skin: Warm and dry Psychiatric: Normal mood and affect   RESULTS  Summary of this visit's results, reviewed and interpreted by myself:   EKG Interpretation  Date/Time:    Ventricular Rate:    PR Interval:    QRS Duration:   QT Interval:    QTC Calculation:   R Axis:     Text Interpretation:         Laboratory Studies: Results for orders placed or performed during the hospital encounter of 12/29/22 (from the past 24 hour(s))  Lipase, blood     Status: None   Collection Time: 12/29/22 12:00 AM  Result Value Ref Range   Lipase 31 11 - 51 U/L  Comprehensive metabolic panel     Status: Abnormal   Collection Time: 12/29/22 12:00 AM  Result Value  Ref Range   Sodium 135 135 - 145 mmol/L   Potassium 3.5 3.5 - 5.1 mmol/L   Chloride 105 98 - 111 mmol/L   CO2 23 22 - 32 mmol/L   Glucose, Bld 103 (H) 70 - 99 mg/dL   BUN 20 6 - 20 mg/dL   Creatinine, Ser 1.05 0.61 - 1.24 mg/dL   Calcium 8.1 (L) 8.9 - 10.3 mg/dL   Total Protein 6.0 (L) 6.5 - 8.1 g/dL   Albumin 3.6 3.5 - 5.0 g/dL   AST 26 15 - 41 U/L   ALT 31 0 - 44 U/L   Alkaline Phosphatase 35 (L) 38 - 126 U/L   Total Bilirubin 0.5 0.3 - 1.2 mg/dL   GFR, Estimated >60 >60 mL/min   Anion gap 7 5 - 15  CBC     Status: None   Collection Time: 12/29/22 12:00 AM  Result Value Ref Range   WBC 6.9 4.0 - 10.5 K/uL   RBC 4.99 4.22 - 5.81 MIL/uL   Hemoglobin 15.1 13.0 -  17.0 g/dL   HCT 45.0 39.0 - 52.0 %   MCV 90.2 80.0 - 100.0 fL   MCH 30.3 26.0 - 34.0 pg   MCHC 33.6 30.0 - 36.0 g/dL   RDW 12.6 11.5 - 15.5 %   Platelets 207 150 - 400 K/uL   nRBC 0.0 0.0 - 0.2 %  Resp panel by RT-PCR (RSV, Flu A&B, Covid) Anterior Nasal Swab     Status: None   Collection Time: 12/29/22 12:00 AM   Specimen: Anterior Nasal Swab  Result Value Ref Range   SARS Coronavirus 2 by RT PCR NEGATIVE NEGATIVE   Influenza A by PCR NEGATIVE NEGATIVE   Influenza B by PCR NEGATIVE NEGATIVE   Resp Syncytial Virus by PCR NEGATIVE NEGATIVE   Imaging Studies: No results found.  ED COURSE and MDM  Nursing notes, initial and subsequent vitals signs, including pulse oximetry, reviewed and interpreted by myself.  Vitals:   12/28/22 2357 12/28/22 2358 12/29/22 0526  BP:  120/86 108/82  Pulse:  76 67  Resp:  20 18  Temp:  97.8 F (36.6 C) 98.5 F (36.9 C)  TempSrc:   Oral  SpO2:  99% 100%  Weight: 93 kg    Height: 6' (1.829 m)     Medications  ondansetron (ZOFRAN-ODT) disintegrating tablet 4 mg (has no administration in time range)  loperamide (IMODIUM) capsule 4 mg (has no administration in time range)  ondansetron (ZOFRAN) injection 4 mg (4 mg Intravenous Given 12/29/22 0543)  sodium chloride 0.9 % bolus 2,000 mL (2,000 mLs Intravenous New Bag/Given 12/29/22 0540)   6:34 AM Nausea well-controlled with IV Zofran.  IV fluid bolus infusing.  Will discharge home after bolus complete.  Imodium given for diarrhea.  Presentation consistent with a viral gastroenteritis.  He is negative for tested respiratory viruses.   PROCEDURES  Procedures   ED DIAGNOSES     ICD-10-CM   1. Gastroenteritis  K52.9          Jerimah Witucki, MD 12/29/22 [phone]

## 2022-12-29 NOTE — ED Notes (Addendum)
Pt reports n/v/d that started yesterday morning.  Pt indicates that he spent 8 hours being sick.  Pt believes it is "a stomach virus."  Pt states he continues to feel dehydrated.

## 2024-04-22 ENCOUNTER — Other Ambulatory Visit: Payer: Self-pay

## 2024-04-22 ENCOUNTER — Encounter (HOSPITAL_BASED_OUTPATIENT_CLINIC_OR_DEPARTMENT_OTHER): Payer: Self-pay | Admitting: Emergency Medicine

## 2024-04-22 ENCOUNTER — Emergency Department (HOSPITAL_BASED_OUTPATIENT_CLINIC_OR_DEPARTMENT_OTHER)
Admission: EM | Admit: 2024-04-22 | Discharge: 2024-04-22 | Disposition: A | Discharge status: Home / Self Care | Payer: PRIVATE HEALTH INSURANCE | Attending: Emergency Medicine | Admitting: Emergency Medicine

## 2024-04-22 DIAGNOSIS — L509 Urticaria, unspecified: Secondary | ICD-10-CM | POA: Diagnosis present

## 2024-04-22 MED ORDER — DIPHENHYDRAMINE HCL 50 MG/ML IJ SOLN
50.0000 mg | Freq: Once | INTRAMUSCULAR | Status: AC
  Administered 2024-04-22: 50 mg via INTRAMUSCULAR
  Filled 2024-04-22: qty 1

## 2024-04-22 MED ORDER — DEXAMETHASONE SODIUM PHOSPHATE 10 MG/ML IJ SOLN
10.0000 mg | Freq: Once | INTRAMUSCULAR | Status: AC
  Administered 2024-04-22: 10 mg via INTRAMUSCULAR
  Filled 2024-04-22: qty 1

## 2024-04-22 MED ORDER — DIPHENHYDRAMINE HCL 25 MG PO TABS: 50.0000 mg | ORAL_TABLET | Freq: Four times a day (QID) | ORAL | 0 refills | Status: AC | PRN

## 2024-04-22 MED ORDER — DIPHENHYDRAMINE HCL 25 MG PO CAPS: 50.0000 mg | ORAL_CAPSULE | Freq: Once | ORAL | Status: DC

## 2024-04-22 NOTE — ED Triage Notes (Signed)
 Pt with hives all over body, worse on thighs and buttocks.  Began today.  Has not tried any interventions. No known new exposures.

## 2024-04-22 NOTE — ED Provider Notes (Signed)
 [place] EMERGENCY DEPARTMENT AT [facility] Provider Note   CSN: [id number] Arrival date & time: 04/22/24  0132     History Chief Complaint  Patient presents with   Urticaria    HPI Dick Angello is a 43 y.o. male presenting for chief complaint of full body allergic reaction.  States he is uncertain what the exposure was but feels there is a relation to eating at a new restaurant tonight.   Denies fevers chills nausea vomiting.   Patient's recorded medical, surgical, social, medication list and allergies were reviewed in the Snapshot window as part of the initial history.   Review of Systems   Review of Systems  Constitutional:  Negative for chills and fever.  HENT:  Negative for ear pain and sore throat.   Eyes:  Negative for pain and visual disturbance.  Respiratory:  Negative for cough and shortness of breath.   Cardiovascular:  Negative for chest pain and palpitations.  Gastrointestinal:  Negative for abdominal pain and vomiting.  Genitourinary:  Negative for dysuria and hematuria.  Musculoskeletal:  Negative for arthralgias and back pain.  Skin:  Positive for rash. Negative for color change.  Neurological:  Negative for seizures and syncope.  All other systems reviewed and are negative.   Physical Exam Updated Vital Signs BP 105/87   Pulse 68   Temp 98 F (36.7 C) (Oral)   Resp 16   SpO2 97%  Physical Exam Vitals and nursing note reviewed.  Constitutional:      General: He is not in acute distress.    Appearance: He is well-developed.  HENT:     Head: Normocephalic and atraumatic.  Eyes:     Conjunctiva/sclera: Conjunctivae normal.  Cardiovascular:     Rate and Rhythm: Normal rate and regular rhythm.     Heart sounds: No murmur heard. Pulmonary:     Effort: Pulmonary effort is normal. No respiratory distress.     Breath sounds: Normal breath sounds.  Abdominal:     Palpations: Abdomen is soft.     Tenderness: There is no abdominal  tenderness.  Musculoskeletal:        General: No swelling.     Cervical back: Neck supple.  Skin:    General: Skin is warm and dry.     Capillary Refill: Capillary refill takes less than 2 seconds.  Neurological:     Mental Status: He is alert.  Psychiatric:        Mood and Affect: Mood normal.      ED Course/ Medical Decision Making/ A&P    Procedures Procedures   Medications Ordered in ED Medications  diphenhydrAMINE (BENADRYL) injection 50 mg (50 mg Intramuscular Given 04/22/24 0201)  dexamethasone (DECADRON) injection 10 mg (10 mg Intramuscular Given 04/22/24 0201)    Medical Decision Making:   Ector Klepacki is a 43 y.o. male who presented to the ED today with urticarial rash detailed above.    Complete initial physical exam performed, notably the patient  was hemodynamically stable no acute distress.  Very pruritic initially.    Reviewed and confirmed nursing documentation for past medical history, family history, social history.    Initial Assessment:   With the patient's presentation of pruritic urticaria, most likely diagnosis is allergic reaction. Other diagnoses were considered including (but not limited to) angioedema, cellulitis, SJS/drug eruption. These are considered less likely due to history of present illness and physical exam findings.   This is most consistent with an acute complicated illness Will  treat with IV and Benadryl and steroids and plan for symptomatic reassessment  Reassessment and Plan:   Patient observed for 90 minutes complete symptomatic reassessment.  He feels comfortable outpatient care management.  Discussed home medications patient expressed understanding.   Disposition:  I have considered need for hospitalization, however, considering all of the above, I believe this patient is stable for discharge at this time.  Patient/family educated about specific return precautions for given chief complaint and symptoms.  Patient/family educated  about follow-up with PCP.     Patient/family expressed understanding of return precautions and need for follow-up. Patient spoken to regarding all imaging and laboratory results and appropriate follow up for these results. All education provided in verbal form with additional information in written form. Time was allowed for answering of patient questions. Patient discharged.    Emergency Department Medication Summary:   Medications  diphenhydrAMINE (BENADRYL) injection 50 mg (50 mg Intramuscular Given 04/22/24 0201)  dexamethasone (DECADRON) injection 10 mg (10 mg Intramuscular Given 04/22/24 0201)         Clinical Impression:  1. Urticaria      Discharge   Final Clinical Impression(s) / ED Diagnoses Final diagnoses:  Urticaria    Rx / DC Orders ED Discharge Orders          Ordered    diphenhydrAMINE (BENADRYL) 25 MG tablet  Every 6 hours PRN        04/22/24 0315              Onetha Bile, MD 04/22/24 4782

## 2024-04-23 ENCOUNTER — Other Ambulatory Visit: Payer: Self-pay

## 2024-04-23 ENCOUNTER — Encounter (HOSPITAL_BASED_OUTPATIENT_CLINIC_OR_DEPARTMENT_OTHER): Payer: Self-pay | Admitting: Emergency Medicine

## 2024-04-23 ENCOUNTER — Emergency Department (HOSPITAL_BASED_OUTPATIENT_CLINIC_OR_DEPARTMENT_OTHER)
Admission: EM | Admit: 2024-04-23 | Discharge: 2024-04-23 | Disposition: A | Discharge status: Home / Self Care | Payer: PRIVATE HEALTH INSURANCE | Attending: Emergency Medicine | Admitting: Emergency Medicine

## 2024-04-23 DIAGNOSIS — T7840XA Allergy, unspecified, initial encounter: Secondary | ICD-10-CM | POA: Diagnosis not present

## 2024-04-23 DIAGNOSIS — R21 Rash and other nonspecific skin eruption: Secondary | ICD-10-CM | POA: Diagnosis present

## 2024-04-23 MED ORDER — LORAZEPAM 1 MG PO TABS
1.0000 mg | ORAL_TABLET | Freq: Once | ORAL | Status: AC
  Administered 2024-04-23: 1 mg via ORAL
  Filled 2024-04-23: qty 1

## 2024-04-23 MED ORDER — DEXAMETHASONE SODIUM PHOSPHATE 10 MG/ML IJ SOLN
10.0000 mg | Freq: Once | INTRAMUSCULAR | Status: AC
  Administered 2024-04-23: 10 mg via INTRAMUSCULAR
  Filled 2024-04-23: qty 1

## 2024-04-23 MED ORDER — DIPHENHYDRAMINE HCL 50 MG/ML IJ SOLN
50.0000 mg | Freq: Once | INTRAMUSCULAR | Status: AC
  Administered 2024-04-23: 50 mg via INTRAMUSCULAR
  Filled 2024-04-23: qty 1

## 2024-04-23 MED ORDER — DIPHENHYDRAMINE HCL 50 MG/ML IJ SOLN: 50.0000 mg | Freq: Once | INTRAMUSCULAR | Status: DC

## 2024-04-23 MED ORDER — PREDNISONE 10 MG (21) PO TBPK: ORAL_TABLET | Freq: Every day | ORAL | 0 refills | Status: AC

## 2024-04-23 NOTE — ED Provider Notes (Signed)
 [place] EMERGENCY DEPARTMENT AT [facility] Provider Note   CSN: [id number] Arrival date & time: 04/23/24  0309     History Chief Complaint  Patient presents with   Urticaria    HPI [name] is a 43 y.o. male presenting for chief complaint of urticaria.  He was seen yesterday for the same. States that his symptoms grossly improved into the evening last night and then recurred today.  He had not been taking Benadryl but tonight he took 5 over the course of 4 hours and states symptoms are very severe.  He states they are starting to improve at this time after the most recent doses of diphenhydramine.  He denies fevers chills nausea vomiting syncope shortness of breath.  He is otherwise ambulatory tolerating p.o. intake.   Patient's recorded medical, surgical, social, medication list and allergies were reviewed in the Snapshot window as part of the initial history.   Review of Systems   Review of Systems  Constitutional:  Negative for chills and fever.  HENT:  Negative for ear pain and sore throat.   Eyes:  Negative for pain and visual disturbance.  Respiratory:  Negative for cough and shortness of breath.   Cardiovascular:  Negative for chest pain and palpitations.  Gastrointestinal:  Negative for abdominal pain and vomiting.  Genitourinary:  Negative for dysuria and hematuria.  Musculoskeletal:  Negative for arthralgias and back pain.  Skin:  Positive for rash. Negative for color change.  Neurological:  Negative for seizures and syncope.  All other systems reviewed and are negative.   Physical Exam Updated Vital Signs BP 124/80   Pulse 72   Temp 97.8 F (36.6 C)   Resp 20   SpO2 96%  Physical Exam Vitals and nursing note reviewed.  Constitutional:      General: He is not in acute distress.    Appearance: He is well-developed.  HENT:     Head: Normocephalic and atraumatic.  Eyes:     Conjunctiva/sclera: Conjunctivae normal.  Cardiovascular:      Rate and Rhythm: Normal rate and regular rhythm.     Heart sounds: No murmur heard. Pulmonary:     Effort: Pulmonary effort is normal. No respiratory distress.     Breath sounds: Normal breath sounds.  Abdominal:     Palpations: Abdomen is soft.     Tenderness: There is no abdominal tenderness.  Musculoskeletal:        General: No swelling.     Cervical back: Neck supple.  Skin:    General: Skin is warm and dry.     Capillary Refill: Capillary refill takes less than 2 seconds.     Findings: Rash present.  Neurological:     Mental Status: He is alert.  Psychiatric:        Mood and Affect: Mood normal.      ED Course/ Medical Decision Making/ A&P    Procedures Procedures   Medications Ordered in ED Medications  dexamethasone (DECADRON) injection 10 mg (10 mg Intramuscular Given 04/23/24 0326)  LORazepam (ATIVAN) tablet 1 mg (1 mg Oral Given 04/23/24 0431)  diphenhydrAMINE (BENADRYL) injection 50 mg (50 mg Intramuscular Given 04/23/24 0546)    Medical Decision Making:   [name] is a 43 y.o. male who presented to the ED today with ongoing rash detailed above.    Complete initial physical exam performed, notably the patient  was hemodynamically stable no acute distress.  No significant change from yesterday.    Reviewed  and confirmed nursing documentation for past medical history, family history, social history.    Initial Assessment:   Patient has ongoing urticarial reaction.  We treated with dexamethasone and Benadryl with symptomatic resolution once again.  He will continue with Benadryl in outpatient setting we will add on steroid burst. Still has no intraoral lesions consistent with SJS.  Has no fevers or symptoms consistent with cellulitis. He is overall well-appearing feels comfortable with outpatient care management at this time.  Disposition:  I have considered need for hospitalization, however, considering all of the above, I believe this patient is stable for  discharge at this time.  Patient/family educated about specific return precautions for given chief complaint and symptoms.  Patient/family educated about follow-up with PCP .     Patient/family expressed understanding of return precautions and need for follow-up. Patient spoken to regarding all imaging and laboratory results and appropriate follow up for these results. All education provided in verbal form with additional information in written form. Time was allowed for answering of patient questions. Patient discharged.    Emergency Department Medication Summary:   Medications  dexamethasone (DECADRON) injection 10 mg (10 mg Intramuscular Given 04/23/24 0326)  LORazepam (ATIVAN) tablet 1 mg (1 mg Oral Given 04/23/24 0431)  diphenhydrAMINE (BENADRYL) injection 50 mg (50 mg Intramuscular Given 04/23/24 0546)        Clinical Impression:  1. Allergic reaction, initial encounter      Discharge   Final Clinical Impression(s) / ED Diagnoses Final diagnoses:  Allergic reaction, initial encounter    Rx / DC Orders ED Discharge Orders          Ordered    predniSONE  (STERAPRED UNI-PAK 21 TAB) 10 MG (21) TBPK tablet  Daily        04/23/24 1914              Onetha Bile, MD 04/23/24 [phone]

## 2024-04-23 NOTE — ED Triage Notes (Signed)
 Pt presents with continuation of hives he was seen for yesterday. He states that the steroids he received yesterday did initially help but the Benadryl is not working. He states " I'm not gonna lie ma'am I've popped 4-5 Benadryl in the last 2 hours". Pt with visible hives to face and hands. Pt states they are over. Denies any airway complaints, n/v/d or other symptoms.

## 2024-04-24 ENCOUNTER — Other Ambulatory Visit: Payer: Self-pay

## 2024-04-24 ENCOUNTER — Emergency Department (HOSPITAL_COMMUNITY)
Admission: EM | Admit: 2024-04-24 | Discharge: 2024-04-24 | Disposition: A | Discharge status: Home / Self Care | Payer: PRIVATE HEALTH INSURANCE | Attending: Emergency Medicine | Admitting: Emergency Medicine

## 2024-04-24 DIAGNOSIS — L299 Pruritus, unspecified: Secondary | ICD-10-CM | POA: Insufficient documentation

## 2024-04-24 MED ORDER — METHYLPREDNISOLONE SODIUM SUCC 125 MG IJ SOLR: 125.0000 mg | INTRAMUSCULAR | Status: DC

## 2024-04-24 NOTE — Discharge Instructions (Signed)
 It was a pleasure taking part in your care.  As discussed, please switch bacterial detergent that you used.  Continue taking steroid taper provided to you by Dr. Urban Garden until it has run out.  Continue taking Benadryl, 25 mg at night for itching.  You may also purchase famotidine, 10 mg, and take this as directed.  Follow-up with PCP, call and make an appoint to be seen.  Return to the ED with any new or worsening symptoms.

## 2024-04-24 NOTE — ED Triage Notes (Signed)
 Pt reports ongoing severe all over body itchiness after being evaluated in the ED. Pt reports taking OTC allergy med and prednisone  without relief.

## 2024-04-24 NOTE — ED Provider Notes (Signed)
 [place] EMERGENCY DEPARTMENT AT [facility] Provider Note   CSN: [id number] Arrival date & time: 04/24/24  1955     History  Chief Complaint  Patient presents with   Pruritis    [name] is a 43 y.o. male with history of sarcoidosis, tobacco abuse, lymphadenopathy, GERD.  Patient presents to ED for evaluation of itchy skin.  He reports that for the last 3 days he has had itchiness mainly located in his groin and buttocks region but also reports located all over.  He states that he has been seen 2 times for this at [facility], provided with steroids and discharge.  He reports he is also taking Benadryl for his symptoms.  He states that he was advised to purchase Allegra which he did do last night.  He states that while waiting for provider to see him, nursing staff asked if he had changed any detergents and he realized that he had change detergents recently.  The patient reports that he feels as if his symptoms are related to him changing detergents recently.  He was provided with clean boxes by nursing staff and he states his symptoms are now resolved.  He denies any pruritus.  Denies any leg swelling.  Denies nausea, vomit, abdominal pain, fevers.  Denies alcohol use.  Denies any abnormal bruising.  HPI     Home Medications Prior to Admission medications   Medication Sig Start Date End Date Taking? Authorizing Provider  diphenhydrAMINE (BENADRYL) 25 MG tablet Take 2 tablets (50 mg total) by mouth every 6 (six) hours as needed for up to 30 doses. 04/22/24   Onetha Bile, MD  ondansetron  (ZOFRAN -ODT) 8 MG disintegrating tablet Take 1 tablet (8 mg total) by mouth every 8 (eight) hours as needed for nausea or vomiting. 12/29/22   Molpus, John, MD  predniSONE  (STERAPRED UNI-PAK 21 TAB) 10 MG (21) TBPK tablet Take by mouth daily. Take 6 tabs by mouth daily  for 2 days, then 5 tabs for 2 days, then 4 tabs for 2 days, then 3 tabs for 2 days, 2 tabs for 2 days,  then 1 tab by mouth daily for 2 days 04/23/24   Onetha Bile, MD      Allergies    Patient has no known allergies.    Review of Systems   Review of Systems  All other systems reviewed and are negative.   Physical Exam Updated Vital Signs BP 134/87   Pulse 75   Temp 97.6 F (36.4 C) (Oral)   Resp 20   SpO2 98%  Physical Exam Vitals and nursing note reviewed.  Constitutional:      General: He is not in acute distress.    Appearance: He is well-developed.  HENT:     Head: Normocephalic and atraumatic.  Eyes:     Conjunctiva/sclera: Conjunctivae normal.  Cardiovascular:     Rate and Rhythm: Normal rate and regular rhythm.     Heart sounds: No murmur heard. Pulmonary:     Effort: Pulmonary effort is normal. No respiratory distress.     Breath sounds: Normal breath sounds.  Abdominal:     Palpations: Abdomen is soft.     Tenderness: There is no abdominal tenderness.  Musculoskeletal:        General: No swelling.     Cervical back: Neck supple.  Skin:    General: Skin is warm and dry.     Capillary Refill: Capillary refill takes less than 2 seconds.  Neurological:     Mental Status: He is alert.  Psychiatric:        Mood and Affect: Mood normal.     ED Results / Procedures / Treatments   Labs (all labs ordered are listed, but only abnormal results are displayed) Labs Reviewed - No data to display   EKG None  Radiology No results found.  Procedures Procedures   Medications Ordered in ED Medications - No data to display  ED Course/ Medical Decision Making/ A&P   Medical Decision Making Amount and/or Complexity of Data Reviewed Labs: ordered.   43 year old male presents for evaluation.  Please see HPI for further details.  On exam patient is afebrile, nontachycardic.  Lung sounds are clear bilaterally, not hypoxic.  Abdomen soft and compressible.  Neurological examination is at baseline.  No evidence of hives, urticaria.  No evidence of lower  extremity edema.  No evidence of SJS, TENS.  No mucosal involvement.  Uvula midline, handling secretions appropriately.  Patient reports to me that once he changed his boxers his pruritus has resolved.  He reports that he believes his pruritus is secondary to changing detergents recently.  He was offered laboratory evaluation however he defers at this time stating that he feels better and wants to go home.  Patient was advised to continue taking steroids provided to him at first visit.  Advised to continue taking Benadryl at night every 6 hours, 25 mg.  Encouraged to begin taking famotidine for any continued itching and to switch back to all detergent.  Patient voiced understanding.  Will refer him to PCP for ongoing medical management.  Have given return precautions and he voiced understanding.  Stable to discharge.   Final Clinical Impression(s) / ED Diagnoses Final diagnoses:  Itching    Rx / DC Orders ED Discharge Orders     None         Kristin Peyer 04/24/24 2247    Jerilynn Montenegro, MD 04/24/24 2329

## 2024-04-27 ENCOUNTER — Other Ambulatory Visit: Payer: Self-pay

## 2024-04-27 ENCOUNTER — Emergency Department (HOSPITAL_BASED_OUTPATIENT_CLINIC_OR_DEPARTMENT_OTHER): Payer: PRIVATE HEALTH INSURANCE

## 2024-04-27 ENCOUNTER — Encounter (HOSPITAL_BASED_OUTPATIENT_CLINIC_OR_DEPARTMENT_OTHER): Payer: Self-pay | Admitting: Emergency Medicine

## 2024-04-27 ENCOUNTER — Emergency Department (HOSPITAL_BASED_OUTPATIENT_CLINIC_OR_DEPARTMENT_OTHER)
Admission: EM | Admit: 2024-04-27 | Discharge: 2024-04-27 | Disposition: A | Discharge status: Home / Self Care | Payer: PRIVATE HEALTH INSURANCE | Attending: Emergency Medicine | Admitting: Emergency Medicine

## 2024-04-27 DIAGNOSIS — R0789 Other chest pain: Secondary | ICD-10-CM | POA: Diagnosis present

## 2024-04-27 DIAGNOSIS — Z720 Tobacco use: Secondary | ICD-10-CM | POA: Insufficient documentation

## 2024-04-27 LAB — CBC
HCT: 44 % (ref 39.0–52.0)
Hemoglobin: 14.6 g/dL (ref 13.0–17.0)
MCH: 30.2 pg (ref 26.0–34.0)
MCHC: 33.2 g/dL (ref 30.0–36.0)
MCV: 91.1 fL (ref 80.0–100.0)
Platelets: 300 10*3/uL (ref 150–400)
RBC: 4.83 MIL/uL (ref 4.22–5.81)
RDW: 12.7 % (ref 11.5–15.5)
WBC: 10.8 10*3/uL — ABNORMAL HIGH (ref 4.0–10.5)
nRBC: 0 % (ref 0.0–0.2)

## 2024-04-27 LAB — BASIC METABOLIC PANEL WITH GFR
Anion gap: 12 (ref 5–15)
BUN: 21 mg/dL — ABNORMAL HIGH (ref 6–20)
CO2: 24 mmol/L (ref 22–32)
Calcium: 8.5 mg/dL — ABNORMAL LOW (ref 8.9–10.3)
Chloride: 104 mmol/L (ref 98–111)
Creatinine, Ser: 1.16 mg/dL (ref 0.61–1.24)
GFR, Estimated: >60 mL/min (ref >60)
Glucose, Bld: 101 mg/dL — ABNORMAL HIGH (ref 70–99)
Potassium: 3.6 mmol/L (ref 3.5–5.1)
Sodium: 140 mmol/L (ref 135–145)

## 2024-04-27 LAB — TROPONIN T, HIGH SENSITIVITY
Troponin T High Sensitivity: <15 ng/L (ref <19)
Troponin T High Sensitivity: <15 ng/L (ref <19)

## 2024-04-27 MED ORDER — ALUM & MAG HYDROXIDE-SIMETH 200-200-20 MG/5ML PO SUSP
30.0000 mL | Freq: Once | ORAL | Status: AC
  Administered 2024-04-27: 30 mL via ORAL
  Filled 2024-04-27: qty 30

## 2024-04-27 NOTE — ED Provider Notes (Addendum)
 [place] [facility] Provider Note   CSN: [id number] Arrival date & time: 04/27/24  1355     History  Chief Complaint  Patient presents with   Chest Pain    [name] is a 43 y.o. male.  Patient with onset of substernal chest pain last evening.  Patient been on prednisone  for allergic reaction last dose was yesterday around 2:00 in the afternoon.  Pain is substernal nonradiating no nausea no vomiting.  Patient did take a Prilosec at home without any relief.  Past medical history is significant for sarcoidosis gastroesophageal reflux disease and tobacco abuse.  Patient states he has stopped the prednisone .  Patient recently seen several times for a rash.  He says that is better.       Home Medications Prior to Admission medications   Medication Sig Start Date End Date Taking? Authorizing Provider  diphenhydrAMINE (BENADRYL) 25 MG tablet Take 2 tablets (50 mg total) by mouth every 6 (six) hours as needed for up to 30 doses. 04/22/24   Onetha Bile, MD  ondansetron  (ZOFRAN -ODT) 8 MG disintegrating tablet Take 1 tablet (8 mg total) by mouth every 8 (eight) hours as needed for nausea or vomiting. 12/29/22   Molpus, John, MD  predniSONE  (STERAPRED UNI-PAK 21 TAB) 10 MG (21) TBPK tablet Take by mouth daily. Take 6 tabs by mouth daily  for 2 days, then 5 tabs for 2 days, then 4 tabs for 2 days, then 3 tabs for 2 days, 2 tabs for 2 days, then 1 tab by mouth daily for 2 days 04/23/24   Onetha Bile, MD      Allergies    Patient has no known allergies.    Review of Systems   Review of Systems  Constitutional:  Negative for chills and fever.  HENT:  Negative for ear pain and sore throat.   Eyes:  Negative for pain and visual disturbance.  Respiratory:  Negative for cough and shortness of breath.   Cardiovascular:  Positive for chest pain. Negative for palpitations.  Gastrointestinal:  Negative for abdominal pain, diarrhea, nausea and  vomiting.  Genitourinary:  Negative for dysuria and hematuria.  Musculoskeletal:  Negative for arthralgias and back pain.  Skin:  Negative for color change and rash.  Neurological:  Negative for seizures and syncope.  All other systems reviewed and are negative.   Physical Exam Updated Vital Signs BP 127/88 (BP Location: Right Arm)   Pulse (!) 56   Temp 98.4 F (36.9 C) (Oral)   Resp 18   Wt 93 kg   SpO2 96%   BMI 27.80 kg/m  Physical Exam Vitals and nursing note reviewed.  Constitutional:      General: He is not in acute distress.    Appearance: Normal appearance. He is well-developed.  HENT:     Head: Normocephalic and atraumatic.  Eyes:     Extraocular Movements: Extraocular movements intact.     Conjunctiva/sclera: Conjunctivae normal.     Pupils: Pupils are equal, round, and reactive to light.  Cardiovascular:     Rate and Rhythm: Normal rate and regular rhythm.     Heart sounds: No murmur heard. Pulmonary:     Effort: Pulmonary effort is normal. No respiratory distress.     Breath sounds: Normal breath sounds.  Chest:     Chest wall: No tenderness.  Abdominal:     Palpations: Abdomen is soft.     Tenderness: There is no abdominal tenderness.  Musculoskeletal:  General: No swelling.     Cervical back: Normal range of motion and neck supple.  Skin:    General: Skin is warm and dry.     Capillary Refill: Capillary refill takes less than 2 seconds.  Neurological:     General: No focal deficit present.     Mental Status: He is alert and oriented to person, place, and time.  Psychiatric:        Mood and Affect: Mood normal.     ED Results / Procedures / Treatments   Labs (all labs ordered are listed, but only abnormal results are displayed) Labs Reviewed  BASIC METABOLIC PANEL WITH GFR - Abnormal; Notable for the following components:      Result Value   Glucose, Bld 101 (*)    BUN 21 (*)    Calcium 8.5 (*)    All other components within normal  limits  CBC - Abnormal; Notable for the following components:   WBC 10.8 (*)    All other components within normal limits  TROPONIN T, HIGH SENSITIVITY  TROPONIN T, HIGH SENSITIVITY    EKG EKG Interpretation Date/Time:  Thursday Apr 27 2024 14:16:50 EDT Ventricular Rate:  60 PR Interval:  150 QRS Duration:  90 QT Interval:  396 QTC Calculation: 396 R Axis:   79  Text Interpretation: Normal sinus rhythm with sinus arrhythmia Normal ECG When compared with ECG of 30-May-2021 10:07, PREVIOUS ECG IS PRESENT Confirmed by Lakyn Mantione [phone]) on 04/27/2024 4:30:28 PM  Radiology DG Chest 2 View Result Date: 04/27/2024 CLINICAL DATA:  Chest pain EXAM: CHEST - 2 VIEW COMPARISON:  05/30/2021 FINDINGS: The heart size and mediastinal contours are within normal limits. Both lungs are clear. The visualized skeletal structures are unremarkable. IMPRESSION: No active cardiopulmonary disease. Electronically Signed   By: Esmeralda Hedge M.D.   On: 04/27/2024 15:29    Procedures Procedures    Medications Ordered in ED Medications  alum & mag hydroxide-simeth (MAALOX/MYLANTA) 200-200-20 MG/5ML suspension 30 mL (has no administration in time range)    ED Course/ Medical Decision Making/ A&P                                 Medical Decision Making Amount and/or Complexity of Data Reviewed Labs: ordered. Radiology: ordered.  Risk OTC drugs.   Patient's initial troponin was less than 15.  Basic metabolic panel is normal.  CBC white count 10.8 hemoglobin 14.6 platelets 300.  Chest x-ray no acute findings.  EKG without any acute findings.  Based on still having symptoms patient needs a delta troponin.  And will give a GI cocktail.  Clinically this sounds like its esophageal irritation from the prednisone .  Vital signs are very reassuring temp 98.4 pulse 56 respirations 18 blood pressure 127/88 oxygen saturation 96% on room air.  Patient nontoxic no acute distress.  Delta troponin is less  than 15 which is very reassuring.  Will recommend patient continue his Prilosec.  Because it could help.  No evidence of any acute cardiac event.  Will also give referral to cardiology for further evaluation.  Also would recommend following up with gastroenterology referral information provided.  Upper endoscopy may be indicated.   Final Clinical Impression(s) / ED Diagnoses Final diagnoses:  Atypical chest pain    Rx / DC Orders ED Discharge Orders     None         Nicklas Barns, MD 04/27/24 [phone]  Allesandra Huebsch, MD 04/27/24 1918    Darwin Rothlisberger, MD 04/27/24 Sear[place] Czech    Tyreesha Maharaj, MD 04/27/24 [phone]

## 2024-04-27 NOTE — ED Triage Notes (Addendum)
 Mid chest pressure that started yesterday . On prednisone  for hives . Reports it may be the cause of his symptoms .  Reports took his GERD meds with no relief .

## 2024-04-27 NOTE — Discharge Instructions (Addendum)
 No evidence of acute cardiac event.  Would recommend continuing the Prilosec though at least for the next 7 days to see if it helps some.  Also chest x-ray negative as well.  Recommend that you follow-up with cardiology just for further evaluation.  Return for any new or worse symptoms.  Work note provided.  Also make an appointment to follow-up with gastroenterology.  In case she need upper endoscopy.

## 2024-04-27 NOTE — ED Notes (Signed)
 ED Provider at bedside.

## 2024-08-31 ENCOUNTER — Emergency Department (HOSPITAL_COMMUNITY)
Admission: EM | Admit: 2024-08-31 | Discharge: 2024-08-31 | Discharge status: Against Medical Advice | Payer: PRIVATE HEALTH INSURANCE | Attending: Emergency Medicine | Admitting: Emergency Medicine

## 2024-08-31 ENCOUNTER — Encounter (HOSPITAL_COMMUNITY): Payer: Self-pay

## 2024-08-31 DIAGNOSIS — Z5321 Procedure and treatment not carried out due to patient leaving prior to being seen by health care provider: Secondary | ICD-10-CM | POA: Insufficient documentation

## 2024-08-31 DIAGNOSIS — K0889 Other specified disorders of teeth and supporting structures: Secondary | ICD-10-CM | POA: Diagnosis present

## 2024-08-31 NOTE — ED Triage Notes (Signed)
 Pt states that he has been having dental pain on the bottom L side for the past 3 weeks
